# Patient Record
Sex: Female | Born: 1945 | Race: White | Hispanic: No | Marital: Married | Smoking: Never smoker
Health system: Southern US, Community
[De-identification: ages and names within clinical notes are randomized; demographics above are authoritative.]

## PROBLEM LIST (undated history)

## (undated) DIAGNOSIS — D869 Sarcoidosis, unspecified: Secondary | ICD-10-CM

## (undated) DIAGNOSIS — R635 Abnormal weight gain: Secondary | ICD-10-CM

## (undated) DIAGNOSIS — K219 Gastro-esophageal reflux disease without esophagitis: Secondary | ICD-10-CM

## (undated) DIAGNOSIS — I10 Essential (primary) hypertension: Secondary | ICD-10-CM

## (undated) HISTORY — DX: Gastro-esophageal reflux disease without esophagitis: K21.9

## (undated) HISTORY — DX: Sarcoidosis, unspecified: D86.9

## (undated) HISTORY — DX: Essential (primary) hypertension: I10

## (undated) HISTORY — DX: Abnormal weight gain: R63.5

---

## 1998-03-16 ENCOUNTER — Ambulatory Visit: Admission: RE | Admit: 1998-03-16 | Discharge: 1998-03-16 | Payer: Self-pay | Admitting: Internal Medicine

## 2000-05-12 ENCOUNTER — Ambulatory Visit (HOSPITAL_COMMUNITY): Admission: RE | Admit: 2000-05-12 | Discharge: 2000-05-12 | Payer: Self-pay | Admitting: Internal Medicine

## 2000-05-12 ENCOUNTER — Encounter: Payer: Self-pay | Admitting: Internal Medicine

## 2001-09-29 ENCOUNTER — Encounter: Admission: RE | Admit: 2001-09-29 | Discharge: 2001-09-29 | Payer: Self-pay | Admitting: Neurosurgery

## 2001-09-29 ENCOUNTER — Encounter: Payer: Self-pay | Admitting: Neurosurgery

## 2001-10-13 ENCOUNTER — Encounter: Payer: Self-pay | Admitting: Neurosurgery

## 2001-10-13 ENCOUNTER — Encounter: Admission: RE | Admit: 2001-10-13 | Discharge: 2001-10-13 | Payer: Self-pay | Admitting: Neurosurgery

## 2003-09-05 ENCOUNTER — Emergency Department (HOSPITAL_COMMUNITY): Admission: EM | Admit: 2003-09-05 | Discharge: 2003-09-05 | Payer: Self-pay | Admitting: Emergency Medicine

## 2003-12-13 ENCOUNTER — Ambulatory Visit: Payer: Self-pay | Admitting: Internal Medicine

## 2004-05-03 ENCOUNTER — Ambulatory Visit: Payer: Self-pay | Admitting: Internal Medicine

## 2004-06-15 ENCOUNTER — Ambulatory Visit: Payer: Self-pay | Admitting: Internal Medicine

## 2004-07-03 ENCOUNTER — Ambulatory Visit: Payer: Self-pay | Admitting: Internal Medicine

## 2004-07-19 ENCOUNTER — Ambulatory Visit: Payer: Self-pay | Admitting: Internal Medicine

## 2004-08-03 ENCOUNTER — Emergency Department (HOSPITAL_COMMUNITY): Admission: EM | Admit: 2004-08-03 | Discharge: 2004-08-03 | Payer: Self-pay | Admitting: Emergency Medicine

## 2004-08-06 ENCOUNTER — Encounter: Admission: RE | Admit: 2004-08-06 | Discharge: 2004-08-06 | Payer: Self-pay | Admitting: Internal Medicine

## 2004-12-17 ENCOUNTER — Ambulatory Visit: Payer: Self-pay | Admitting: Internal Medicine

## 2005-01-28 ENCOUNTER — Ambulatory Visit: Payer: Self-pay | Admitting: Internal Medicine

## 2005-02-25 ENCOUNTER — Ambulatory Visit: Payer: Self-pay | Admitting: Internal Medicine

## 2005-04-02 ENCOUNTER — Ambulatory Visit: Payer: Self-pay | Admitting: Internal Medicine

## 2005-05-15 ENCOUNTER — Ambulatory Visit: Payer: Self-pay | Admitting: Internal Medicine

## 2005-08-23 ENCOUNTER — Ambulatory Visit: Payer: Self-pay | Admitting: Internal Medicine

## 2005-09-03 ENCOUNTER — Ambulatory Visit: Payer: Self-pay | Admitting: Internal Medicine

## 2005-10-31 ENCOUNTER — Ambulatory Visit: Payer: Self-pay | Admitting: Internal Medicine

## 2006-06-24 ENCOUNTER — Ambulatory Visit: Payer: Self-pay | Admitting: Internal Medicine

## 2006-07-08 ENCOUNTER — Ambulatory Visit: Payer: Self-pay | Admitting: Internal Medicine

## 2006-09-23 ENCOUNTER — Ambulatory Visit: Payer: Self-pay | Admitting: Internal Medicine

## 2006-11-27 DIAGNOSIS — D869 Sarcoidosis, unspecified: Secondary | ICD-10-CM

## 2006-11-27 DIAGNOSIS — I10 Essential (primary) hypertension: Secondary | ICD-10-CM | POA: Insufficient documentation

## 2006-11-27 DIAGNOSIS — K219 Gastro-esophageal reflux disease without esophagitis: Secondary | ICD-10-CM

## 2007-04-17 ENCOUNTER — Ambulatory Visit: Payer: Self-pay | Admitting: Internal Medicine

## 2007-04-27 ENCOUNTER — Ambulatory Visit: Payer: Self-pay | Admitting: Internal Medicine

## 2007-05-14 ENCOUNTER — Ambulatory Visit: Payer: Self-pay | Admitting: Internal Medicine

## 2007-06-26 ENCOUNTER — Ambulatory Visit: Payer: Self-pay | Admitting: Internal Medicine

## 2007-08-07 ENCOUNTER — Telehealth (INDEPENDENT_AMBULATORY_CARE_PROVIDER_SITE_OTHER): Payer: Self-pay | Admitting: *Deleted

## 2007-10-23 ENCOUNTER — Telehealth (INDEPENDENT_AMBULATORY_CARE_PROVIDER_SITE_OTHER): Payer: Self-pay | Admitting: *Deleted

## 2007-10-23 ENCOUNTER — Ambulatory Visit: Payer: Self-pay | Admitting: Internal Medicine

## 2007-10-23 DIAGNOSIS — J069 Acute upper respiratory infection, unspecified: Secondary | ICD-10-CM | POA: Insufficient documentation

## 2007-10-29 ENCOUNTER — Telehealth: Payer: Self-pay | Admitting: Adult Health

## 2007-11-13 ENCOUNTER — Ambulatory Visit: Payer: Self-pay | Admitting: Internal Medicine

## 2008-07-26 ENCOUNTER — Ambulatory Visit: Payer: Self-pay | Admitting: Internal Medicine

## 2009-03-01 ENCOUNTER — Ambulatory Visit: Payer: Self-pay | Admitting: Internal Medicine

## 2009-04-12 ENCOUNTER — Ambulatory Visit: Payer: Self-pay | Admitting: Internal Medicine

## 2009-06-29 ENCOUNTER — Ambulatory Visit: Payer: Self-pay | Admitting: Internal Medicine

## 2009-07-09 ENCOUNTER — Encounter: Payer: Self-pay | Admitting: Internal Medicine

## 2009-07-13 ENCOUNTER — Telehealth (INDEPENDENT_AMBULATORY_CARE_PROVIDER_SITE_OTHER): Payer: Self-pay | Admitting: *Deleted

## 2009-07-14 ENCOUNTER — Ambulatory Visit: Payer: Self-pay | Admitting: Internal Medicine

## 2009-07-28 ENCOUNTER — Ambulatory Visit: Payer: Self-pay | Admitting: Internal Medicine

## 2009-09-15 ENCOUNTER — Ambulatory Visit: Payer: Self-pay | Admitting: Internal Medicine

## 2009-10-30 ENCOUNTER — Ambulatory Visit: Payer: Self-pay | Admitting: Internal Medicine

## 2009-12-20 ENCOUNTER — Ambulatory Visit: Payer: Self-pay | Admitting: Internal Medicine

## 2010-02-13 NOTE — Assessment & Plan Note (Signed)
Summary: Pulmonary/ f/u sarcoid try q 3 day prednisone   [facility] Provider[facility]Ref[facility] Provider:  [facility]  CC:  63 month followup.  Pt states overall she is doing well.  She does [place]/o PND and occ prod cough with clear sputum "getting over URI".  .  History of Present Illness: 65 yowf never smoker  with a history of sarcoid, manifested clinically with both shin rash and cough and recurrent since diagnosis in 2000 although in retrospect she  had previous parenchymal changes suggestive of sarcoid dating back at least 5 years prior to dx.  Tapered now to Prednisone 5mg  / day and  better until 6/7 cough sob and ns fatigue and no better by 6/10 so increased to 10 mg and now better..   rec to try to taper to prednsione 5mg  once daily (floor--20mg  ceiling)  October 23, 2007 ov @ 5mg / day complains of cough, congestion, sore throat, nasal drip/drainage, wheezing. cough mainly dry. no purulent sputum. Denies chest pain, dyspnea, orthopnea, hemoptysis, fever, n/v/d, edema, neck stiffness.  rx up to 20/d and zpak  November 13, 2007 tapered back to 5 mg successfullly no rash, cough, sob, aches and pains.  July 26, 2008 ov @ prednisone 5 mg /day no flare in symptoms. rec 5 mg every other day   March 01, 2009 63 month followup.  Pt states overall she is doing well.  She does c/o PND and occ prod cough with clear sputum "getting over URI".  no rash or typical fatigue like she had before prednisone. good ex tol. no ocular or articular symptoms. Pt denies any significant sore throat, dysphagia, itching, sneezing,  nasal congestion or excess secretions,  fever, chills, sweats, unintended wt loss, pleuritic or exertional cp, hempoptysis, change in activity tolerance  orthopnea pnd or leg swelling   Current Medications (verified): 1)  Prilosec 20 Mg  Cpdr (Omeprazole) .... Two Times A Day 2)  Micardis Hct 80-25 Mg  Tabs (Telmisartan-Hctz) .... 1/2 Once Daily 3)  Alavert 10 Mg  Tbdp (Loratadine) .... Once  Daily As Needed 4)  Prednisone 10 Mg  Tabs (Prednisone) .... 1/2 Every Other Day  Allergies (verified): No Known Drug Allergies  Past History:  Past Medical History: HYPERTENSION (ICD-401.9) WEIGHT GAIN    -  Target wt  =  179  for BMI < 30   GERD (ICD-530.81) SARCOIDOSIS (ICD-135) Prednisone 2000 -2006 skin/cough...............................Marland KitchenWert  - Pred restarted daily 04/2007 > every third day dosing March 01, 2009   Vital Signs:  Patient profile:   65 year old female Weight:      174.13 pounds BMI:     29.08 O2 Sat:      95 % on Room air Temp:     97.8 degrees F oral Pulse rate:   80 / minute BP sitting:   120 / 60  (left arm)  Vitals Entered By: Vernie Murders (March 01, 2009 10:01 AM)  O2 Flow:  Room air  Physical Exam  Additional Exam:  GENERAL:  A/Ox3; pleasant & cooperative.NAD  wt 191 > 188 July 27, 2008 > 174 March 01, 2009   HEENT:  [place]/AT,, EACs-clear, TMs-wnl, NOSE-clear, THROAT-clear & wnl. NECK:  Supple w/ fair ROM; no JVD; normal carotid impulses w/o bruits; no thyromegaly or nodules palpated; no lymphadenopathy. CHEST:  a few pops and squeaks on insp, no wheeze on exp HEART:  RRR, no m/r/g  heard ABDOMEN:  Soft & nt; nml bowel sounds; no organomegaly or masses detected. EXT: Warm bilat,  no calf pain, edema, clubbing, pulses intact Skin: no rash/lesion    CXR  Procedure date:  03/01/2009  Findings:       Comparison: 07/26/2008   Findings: Trachea is midline.  Widening of the mediastinal contours is likely due to adenopathy, stable.  Suspect bihilar adenopathy. There is coarsening of the pulmonary markings with areas of increased density and architectural distortion.  Appearance is unchanged from 07/26/2008.  No pleural fluid.   IMPRESSION: Mediastinal and probable hilar adenopathy, together with pulmonary parenchymal changes, are consistent with the given history of sarcoid.  No acute findings.    Impression &  Recommendations:  Problem # 1:  SARCOIDOSIS (ICD-135)  The goal with a chronic steroid dependent illness is always arriving at the lowest effective dose that controls the disease/symptoms and not accepting a set "formula" which is based on statistics that don't take into accound individual variability or the natural hx of the dz in every individual patient, which may well vary over time.   Since no progression on 5 mg daiily,  try 5 mg every 3rd day   Each maintenance medication was reviewed in detail including most importantly the difference between maintenance and as needed and under what circumstances the prns are to be used. See instructions for specific recommendations   Medications Added to Medication List This Visit: 1)  Prednisone 10 Mg Tabs (Prednisone) .... 1/2 every other day  Other Orders: Est. Patient Level III (04540) T-2 View CXR (71020TC)  Patient Instructions: 1)  Try 10 mg one half every 3rd day and resume every other day for any excess fatigue nausea rash cough or wors short of breath 2)  Please schedule a follow-up appointment in 6 weeks to consider stopping prednisone

## 2010-02-13 NOTE — Progress Notes (Signed)
Summary: talk to nurse  Phone Note Call from Patient Call back at Home Phone [phone]   Caller: Patient Call For: wert Reason for Call: Talk to Nurse Summary of Call: Pt states she was admitted to [facility] on 6/26 and d/c on 6/27, re: sarcoid flare up, wants to talk with nurse in ref to this and also about her prednisone increase. Initial call taken by: Darletta Moll,  July 13, 2009 10:05 AM  Follow-up for Phone Call        PAtient had recent hospitalization in [facility] for sarcoid flare. She will contact the hospital for her records and get them to make a cd of any films done while there. She will see MW on Friday, 07/14/2009 for follow-up. Thinks she needs to be seen beofre the holiday weekend to see if MW has any recs or wants to change any treatment. Follow-up by: Michel Bickers CMA,  July 13, 2009 10:31 AM

## 2010-02-13 NOTE — Assessment & Plan Note (Signed)
Summary: Pulmonary/  f/u ov doing better on dulera 200 and pred 5 mg qod    [facility]  CC:  Cough- much improved.  History of Present Illness: 65 yowf never smoker  with a history of sarcoid, manifested clinically with both shin rash and cough and recurrent since diagnosis in 2000 although in retrospect she  had previous parenchymal changes suggestive of sarcoid dating back at least 5 years prior to dx.  July 14, 2009  ov p  hospital discharged. from [facility] on 07/09/09 after having severe dry hacky cough and NVD.  She was discharged on 07/10/09.  c/o still coughing- occ prod with clear to green sputum.  She also c/o fatigue better breathing after albuterol.  rec dulera 100 / pred back up to 20 mg daily  July 28, 2009 ov  cough is much improved.  She states that she onyl has cough occ. Still feels fatigued but relates this to wt gain. still on 20 mg per day. no sob.  rec dulera 100 and use ceiling of 20 a floor of 5 mg every other day  September 15, 2009 ov c/o increase cough on 5 mg every other day x 10 days and so increased to 10 mg per day and back to baseline.  rec increase dulera to 200, work on hfa  October 30, 2009 cc  Dyspnea and cough- some better since last ov. Started to wheeze over the past few days despite consistent dulera 200 rx  no rash, ocular or articular co's. rec no change rx work on hfa  December 20, 2009 ov Cough- much improved on dulera 200 doing much better with hfa technique and pred maintained at 5 mg every other day, no sob, rash, ocular or articular c/o's.  Pt denies any significant sore throat, dysphagia, itching, sneezing,  nasal congestion or excess secretions,  fever, chills, sweats, unintended wt loss, pleuritic or exertional cp, hempoptysis, change in activity tolerance  orthopnea pnd or leg swelling     Current Medications (verified): 1)  Prilosec 20 Mg  Cpdr (Omeprazole) .... Take One 30-60 Min Before  First and Last Meals of The Day 2)  Micardis Hct 80-25 Mg  Tabs (Telmisartan-Hctz) .... 1/2 Once Daily 3)  Zyrtec Allergy 10 Mg Tabs (Cetirizine Hcl) .Marland Kitchen.. 1 Once Daily As Needed 4)  Prednisone 10 Mg  Tabs (Prednisone) .... 1/2 Once Daily Every Other Day 5)  Tramadol Hcl 50 Mg  Tabs (Tramadol Hcl) .... One To Two By Mouth Every 4-6 Hours For Pain or Cough 6)  Dulera 200-5 Mcg/act Aero (Mometasone Furo-Formoterol Fum) .... 2 Puffs First Thing  in Am and 2 Puffs Again in Pm About 12 Hours Later  Allergies (verified): No Known Drug Allergies  Past History:  Past Medical History: HYPERTENSION (ICD-401.9) WEIGHT GAIN    -  Target wt  =  179  for BMI < 30   GERD (ICD-530.81) SARCOIDOSIS (ICD-135) Prednisone 2000 -2006 rash/cough...................................Marland KitchenWert/ [place]  - Pred restarted daily 04/2007 > every third day dosing March 01, 2009 > taper off by May 14 2009 > flared by middle of May 2011 > resume prednisone May 28 2009 for sob/cough  -  dulera 100 for ? airway involvement > HFA 25% July 28, 2009 > 50% September 15, 2009,  rx 200 [place]  - [place] 90% October 30, 2009   Vital Signs:  Patient profile:   65 year old female Weight:  177 pounds BMI:     29.56 O2 Sat:      97 % on Room air Temp:     97.4 degrees F oral Pulse rate:   87 / minute BP sitting:   118 / 70  (left arm)  Vitals Entered By: Vernie Murders (December 20, 2009 3:20 PM)  O2 Flow:  Room air  Physical Exam  Additional Exam:  GENERAL:  A/Ox3; pleasant & cooperative.NAD   wt  188 July 27, 2008  > 177 July 28, 2009 > 171 September 15, 2009 > 176 October 30, 2009 > 177 December 20, 2009  HEENT:  [place]/AT,, EACs-clear, TMs-wnl, NOSE-clear, THROAT-clear & wnl. NECK:  Supple w/ fair ROM; no JVD; normal carotid impulses w/o bruits; no thyromegaly or nodules palpated; no lymphadenopathy. CHEST:  a few pops and squeaks on insp, no sign wheeze on exp HEART:  RRR, no m/r/g  heard ABDOMEN:  Soft & nt; nml  bowel sounds; no organomegaly or masses detected. EXT: Warm bilat,  no calf pain, edema, clubbing, pulses intact    Impression & Recommendations:  Problem # 1:  SARCOIDOSIS (ICD-135) The goal with a chronic steroid dependent illness is always arriving at the lowest effective dose that controls the disease/symptoms and not accepting a set "formula" which is based on statistics that don't take into accound individual variability or the natural hx of the dz in every individual patient, which may well vary over time.  on nearly physiologic levels of pred as floor @ 5 mg qod  Seems to be respondding to dulera 200  consistently     Each maintenance medication was reviewed in detail including most importantly the difference between maintenance and as needed and under what circumstances the prns are to be used. See instructions for specific recommendations   Medications Added to Medication List This Visit: 1)  Zyrtec Allergy 10 Mg Tabs (Cetirizine hcl) .Marland Kitchen.. 1 once daily as needed 2)  Prednisone 10 Mg Tabs (Prednisone) .... 1/2 once daily every other day  Other Orders: Est. Patient Level III (78295)  Patient Instructions: 1)  Prednisone celing is 20 mg taper to a floor 5 mg every day - if flare of symptoms ok to double the dose until better x 5 days  2)  continue dulera 200 2 puffs first thing  in am and 2 puffs again in pm about 12 hours later  3)  Return to office in 3 months, sooner if needed with cxr on return Prescriptions: DULERA 200-5 MCG/ACT AERO (MOMETASONE FURO-FORMOTEROL FUM) 2 puffs first thing  in am and 2 puffs again in pm about 12 hours later  #1 x 11   Entered and Authorized by:   Nyoka Cowden MD   Signed by:   Nyoka Cowden MD on 12/20/2009   Method used:   Electronically to        [facility] [id number] (retail)       [address]       [place], [place]  [address]       Ph: [phone]       Fax: [phone]   RxID:   [id number]

## 2010-02-13 NOTE — Assessment & Plan Note (Signed)
Summary: Pulmonary/ f/u sarcoid, taper off  by May 1    [facility]  [facility]  CC:  6 wk followup.  Pt states overall doing well.  Denies any cough or SOB.  She c/o rash on lower left leg x 1 wk.  .  History of Present Illness: 57 yowf never smoker  with a history of sarcoid, manifested clinically with both shin rash and cough and recurrent since diagnosis in 2000 although in retrospect she  had previous parenchymal changes suggestive of sarcoid dating back at least 5 years prior to dx.  Tapered now to Prednisone 5mg  / day and  better until 6/7 cough sob and ns fatigue and no better by 6/10 so increased to 10 mg and now better..   rec to try to taper to prednsione 5mg  once daily (floor--20mg  ceiling)  October 23, 2007 ov @ 5mg / day complains of cough, congestion, sore throat, nasal drip/drainage, wheezing. cough mainly dry. no purulent sputum. Denies chest pain, dyspnea, orthopnea, hemoptysis, fever, n/v/d, edema, neck stiffness.  rx up to 20/d and zpak  November 13, 2007 tapered back to 5 mg successfullly no rash, cough, sob, aches and pains.  July 26, 2008 ov @ prednisone 5 mg /day no flare in symptoms. rec 5 mg every other day   March 01, 2009 6 month followup.  Pt states overall she is doing well.  She does c/o PND and occ prod cough with clear sputum "getting over URI".  no rash or typical fatigue like she had before prednisone. good ex tol. no ocular or articular symptoms.  rec q 3day pred 10 one half  April 12, 2009 6 wk followup.  Pt states overall doing well.  Denies any cough or SOB.  She c/o rash on lower left leg x 1 wk.  no ocular or articular c/os, no cough or sob or nausea or fatigue. Pt denies any significant sore throat, dysphagia, itching, sneezing,  nasal congestion or excess secretions,  fever, chills, sweats, unintended wt loss, pleuritic or exertional cp, hempoptysis, change in activity tolerance  orthopnea pnd or leg swelling   Current  Medications (verified): 1)  Prilosec 20 Mg  Cpdr (Omeprazole) .... Two Times A Day 2)  Micardis Hct 80-25 Mg  Tabs (Telmisartan-Hctz) .... 1/2 Once Daily 3)  Alavert 10 Mg  Tbdp (Loratadine) .... Once Daily As Needed 4)  Prednisone 10 Mg  Tabs (Prednisone) .... 1/2 Every Third Day  Allergies (verified): No Known Drug Allergies  Past History:  Past Medical History: HYPERTENSION (ICD-401.9) WEIGHT GAIN    -  Target wt  =  179  for BMI < 30   GERD (ICD-530.81) SARCOIDOSIS (ICD-135) Prednisone 2000 -2006 skin/cough...............................Marland KitchenWert  - Pred restarted daily 04/2007 > every third day dosing March 01, 2009 > taper off by May 14 2009  Vital Signs:  Patient profile:   65 year old female Weight:      174 pounds O2 Sat:      96 % on Room air Temp:     97.4 degrees F oral Pulse rate:   101 / minute BP sitting:   114 / 78  (left arm)  Vitals Entered By: Vernie Murders (April 12, 2009 9:29 AM)  O2 Flow:  Room air  Physical Exam  Additional Exam:  GENERAL:  A/Ox3; pleasant & cooperative.NAD   wt  188 July 27, 2008 > 174 March 01, 2009  > 174 April 12, 2009  HEENT:  [place]/AT,, EACs-clear, TMs-wnl, NOSE-clear,  THROAT-clear & wnl. NECK:  Supple w/ fair ROM; no JVD; normal carotid impulses w/o bruits; no thyromegaly or nodules palpated; no lymphadenopathy. CHEST:  a few pops and squeaks on insp, no wheeze on exp HEART:  RRR, no m/r/g  heard ABDOMEN:  Soft & nt; nml bowel sounds; no organomegaly or masses detected. EXT: Warm bilat,  no calf pain, edema, clubbing, pulses intact Skin: purplish macular rash palm sized over L lat shin near lateral maleolus   Impression & Recommendations:  Problem # 1:  SARCOIDOSIS (ICD-135)  The goal with a chronic steroid dependent illness is always arriving at the lowest effective dose that controls the disease/symptoms and not accepting a set "formula" which is based on statistics that don't take into accound individual variability or  the natural hx of the dz in every individual patient, which may well vary over time.   Since no progression on 5 mg  every third day,   try 2. 5 mg every 3rd day until May 1 then off   Each maintenance medication was reviewed in detail including most importantly the difference between maintenance and as needed and under what circumstances the prns are to be used. See instructions for specific recommendations   Medications Added to Medication List This Visit: 1)  Prednisone 10 Mg Tabs (Prednisone) .... 1/2 every third day 2)  Prednisone 2.5 Mg Tabs (Prednisone) .... Take every 3rd day, stop on may 1  Other Orders: Est. Patient Level III (16109)  Patient Instructions: 1)  Prednsione 2.5 mg every 3rd day  every other day through May 1 and then stop for any excess fatigue nausea rash cough or worse short of breath 2)  Call your dermatologist about the ankle rash 3)  Please schedule a follow-up appointment in 6 weeks for cxr Prescriptions: PREDNISONE 2.5 MG TABS (PREDNISONE) Take every 3rd day, stop on May 1  #20 x 0   Entered and Authorized by:   Nyoka Cowden MD   Signed by:   Nyoka Cowden MD on 04/12/2009   Method used:   Electronically to        [facility] [address] (retail)       [address]       [place], [place]  [address]       Ph: [phone]       Fax: [phone]   RxID:   [id number]

## 2010-02-13 NOTE — Assessment & Plan Note (Signed)
Summary: Pulmonary/ f/u sarcoid > increase dulera to 200 2bid   [facility]Ref[facility] Provider:  [facility])  CC:  6 wk followup.  Pt states that when tapered down to prednisone to 5 mg every other day her cough and SOB flared back up- started back on 10 mg daily 1 wk ago and starting to improve again.Allison Osborn  History of Present Illness: 65 yowf never smoker  with a history of sarcoid, manifested clinically with both shin rash and cough and recurrent since diagnosis in 2000 although in retrospect she  had previous parenchymal changes suggestive of sarcoid dating back at least 5 years prior to dx.  Tapered now to Prednisone 5mg  / day and  better until 6/7 cough sob and ns fatigue and no better by 6/10 so increased to 10 mg and now better..   rec to try to taper to prednsione 5mg  once daily (floor--20mg  ceiling)  October 23, 2007 ov @ 5mg / day complains of cough, congestion, sore throat, nasal drip/drainage, wheezing. cough mainly dry. no purulent sputum. Denies chest pain, dyspnea, orthopnea, hemoptysis, fever, n/v/d, edema, neck stiffness.  rx up to 20/d and zpak  November 13, 2007 tapered back to 5 mg successfullly no rash, cough, sob, aches and pains.  July 26, 2008 ov @ prednisone 5 mg /day no flare in symptoms. rec 5 mg every other day   March 01, 2009 6 month followup.  Pt states overall she is doing well.  She does c/o PND and occ prod cough with clear sputum "getting over URI".  no rash or typical fatigue like she had before prednisone. good ex tol. no ocular or articular symptoms.  rec q 3day pred 10 one half  April 12, 2009 6 wk followup.  Pt states overall doing well.  Denies any cough or SOB.  She c/o rash on lower left leg x 1 wk.  no ocular or articular c/os tapered off May 14 2009 w/in 2 weeks cough tired sob and so went back on 10 mg   June 29, 2009 ov tapered back down 5 mg every other day since last flare in middle of may but still not completely better6 wk followup.  Pt  c/o cough since the middle of May.  Cough is non prod.   rec leave pred @ 10 until better admit 6/27 with ? pna rx with abx   see page 2 July 14, 2009 Followup after hospital discharged.  Pt states that she went to [facility] on 07/09/09 after having severe dry hacky cough and NVD.  She was discharged on 07/10/09.  She states that she is still coughing- occ prod with clear to green sputum.  She also c/o fatigue better breathing after albuterol.  rec dulera 100 and back up to 20 mg daily  July 28, 2009 ov  cough is much improved.  She states that she onyl has cough occ. Still feels fatigued but relates this to wt gain. still on 20 mg per day. no sob.  rec dulera 100 and use ceiling of 20 a floor of 5 mg every other day  September 15, 2009 ov c/o increase cough on 5 mg every other day x 10 days and so increased to 10 mg per day and back to baseline. Pt denies any significant sore throat, dysphagia, itching, sneezing,  nasal congestion or excess secretions,  fever, chills, sweats, unintended wt loss, pleuritic or exertional cp, hempoptysis, change in activity tolerance  orthopnea pnd or leg swelling  no rash, arthalgia, ocular co's  Current Medications (verified): 1)  Prilosec 20 Mg  Cpdr (Omeprazole) .... Take One 30-60 Min Before First and Last Meals of The Day 2)  Micardis Hct 80-25 Mg  Tabs (Telmisartan-Hctz) .... 1/2 Once Daily 3)  Zyrtec Allergy 10 Mg Tabs (Cetirizine Hcl) .Allison Osborn.. 1 Once Daily 4)  Prednisone 10 Mg  Tabs (Prednisone) .... Tapered Dose As Directed 5)  Dulera 100-5 Mcg/act Aero (Mometasone Furo-Formoterol Fum) .... 2 Puffs First Thing  in Am and 2 Puffs Again in Pm About 12 Hours Later 6)  Tramadol Hcl 50 Mg  Tabs (Tramadol Hcl) .... One To Two By Mouth Every 4-6 Hours For Pain or Cough  Allergies (verified): No Known Drug Allergies  Past History:  Past Medical History: HYPERTENSION (ICD-401.9) WEIGHT GAIN    -  Target wt  =  179  for BMI < 30   GERD  (ICD-530.81) SARCOIDOSIS (ICD-135) Prednisone 2000 -2006 rash/cough...............................Allison KitchenWert/ Dorinda Hill  - Pred restarted daily 04/2007 > every third day dosing March 01, 2009 > taper off by May 14 2009 > flared by middle of May 2011 > resume prednisone May 28 2009 for sob/cough  -  dulera 100 for ? airway involvement > HFA 25% July 28, 2009 > 50% September 15, 2009,  rx 200 dulera  Vital Signs:  Patient profile:   65 year old female Weight:      171 pounds O2 Sat:      92 % on Room air Temp:     98.1 degrees F oral Pulse rate:   82 / minute BP sitting:   144 / 82  (left arm)  Vitals Entered By: Vernie Murders (September 15, 2009 3:57 PM)  O2 Flow:  Room air  Physical Exam  Additional Exam:  GENERAL:  A/Ox3; pleasant & cooperative.NAD   wt  188 July 27, 2008  > 174 June 29, 2009 > 171 July 14, 2009 > 177 July 28, 2009 > 171 September 15, 2009  HEENT:  [place]/AT,, EACs-clear, TMs-wnl, NOSE-clear, THROAT-clear & wnl. NECK:  Supple w/ fair ROM; no JVD; normal carotid impulses w/o bruits; no thyromegaly or nodules palpated; no lymphadenopathy. CHEST:  a few pops and squeaks on insp, no sign wheeze on exp HEART:  RRR, no m/r/g  heard ABDOMEN:  Soft & nt; nml bowel sounds; no organomegaly or masses detected. EXT: Warm bilat,  no calf pain, edema, clubbing, pulses intact    Impression & Recommendations:  Problem # 1:  SARCOIDOSIS (ICD-135) The goal with a chronic steroid dependent illness is always arriving at the lowest effective dose that controls the disease/symptoms and not accepting a set "formula" which is based on statistics that don't take into accound individual variability or the natural hx of the dz in every individual patient, which may well vary over time.   Since most likely flaring with ? URI or pna (can't tell for sure) new ceiling of 20 mg per day and raise the floor dose to 5 mg every day and  try increase dulera to 200 2 puffs first thing  in am and 2  puffs again in pm about 12 hours later   I spent extra time with the patient today explaining optimal mdi  technique.  This improved from  50-75%% effective.  See instructions for specific recommendations    Each maintenance medication was reviewed in detail including most importantly the difference between maintenance and as needed and under what circumstances the  prns are to be used. See instructions for specific recommendations   Medications Added to Medication List This Visit: 1)  Zyrtec Allergy 10 Mg Tabs (Cetirizine hcl) .Allison Osborn.. 1 once daily 2)  Dulera 200-5 Mcg/act Aero (Mometasone furo-formoterol fum) .... 2 puffs first thing  in am and 2 puffs again in pm about 12 hours later  Other Orders: Est. Patient Level IV (38756)  Patient Instructions: 1)  Change dulera 200  2 puffs first thing  in am and 2 puffs again in pm about 12 hours later and Work on inhaler technique:  relax and blow all the way out then take a nice smooth deep breath back in, triggering the inhaler at same time you start breathing in, hold breath  a few seconds then rinse and gargle 2)  Prednisone celing is 20 mg taper to a floor 5 mg every day 3)  Please schedule a follow-up appointment in 6 weeks, sooner if needed  Prescriptions: DULERA 200-5 MCG/ACT AERO (MOMETASONE FURO-FORMOTEROL FUM) 2 puffs first thing  in am and 2 puffs again in pm about 12 hours later  #1 x 11   Entered and Authorized by:   Nyoka Cowden MD   Signed by:   Nyoka Cowden MD on 09/15/2009   Method used:   Print then Give to Patient   RxID:   [id number]

## 2010-02-13 NOTE — Assessment & Plan Note (Signed)
Summary: Pulmonary/  f/u ov with hfa at 50% p coaching   Primary Provider/Referring Provider:  [facility])  CC:  2 wk followup.  Pt states that cough is much improved.  She states that she onyl has cough occ. Still feels fatigued but relates this to wt gain.Marland Kitchen  History of Present Illness: 65 yowf never smoker  with a history of sarcoid, manifested clinically with both shin rash and cough and recurrent since diagnosis in 2000 although in retrospect she  had previous parenchymal changes suggestive of sarcoid dating back at least 5 years prior to dx.  Tapered now to Prednisone 5mg  / day and  better until 6/7 cough sob and ns fatigue and no better by 6/10 so increased to 10 mg and now better..   rec to try to taper to prednsione 5mg  once daily (floor--20mg  ceiling)  October 23, 2007 ov @ 5mg / day complains of cough, congestion, sore throat, nasal drip/drainage, wheezing. cough mainly dry. no purulent sputum. Denies chest pain, dyspnea, orthopnea, hemoptysis, fever, n/v/d, edema, neck stiffness.  rx up to 20/d and zpak  November 13, 2007 tapered back to 5 mg successfullly no rash, cough, sob, aches and pains.  July 26, 2008 ov @ prednisone 5 mg /day no flare in symptoms. rec 5 mg every other day   March 01, 2009 6 month followup.  Pt states overall she is doing well.  She does c/o PND and occ prod cough with clear sputum "getting over URI".  no rash or typical fatigue like she had before prednisone. good ex tol. no ocular or articular symptoms.  rec q 3day pred 10 one half  April 12, 2009 6 wk followup.  Pt states overall doing well.  Denies any cough or SOB.  She c/o rash on lower left leg x 1 wk.  no ocular or articular c/os tapered off May 14 2009 w/in 2 weeks cough tired sob and so went back on 10 mg   June 29, 2009 ov tapered back down 5 mg every other day since last flare in middle of may but still not completely better6 wk followup.  Pt c/o cough since the middle of May.  Cough is non  prod.   rec leave pred @ 10 until better admit 6/27 with ? pna rx with abx   see page 2 July 14, 2009 Followup after hospital discharged.  Pt states that she went to [facility] on 07/09/09 after having severe dry hacky cough and NVD.  She was discharged on 07/10/09.  She states that she is still coughing- occ prod with clear to green sputum.  She also c/o fatigue better breathing after albuterol.  rec dulera 100 and back up to 20 mg daily  July 28, 2009 ov t cough is much improved.  She states that she onyl has cough occ. Still feels fatigued but relates this to wt gain. still on 20 mg per day. no sob.  Pt denies any significant sore throat, dysphagia, itching, sneezing,  nasal congestion or excess secretions,  fever, chills, sweats, unintended wt loss, pleuritic or exertional cp, hempoptysis, change in activity tolerance  orthopnea pnd or leg swelling. Pt also denies any obvious fluctuation in symptoms with weather or environmental change or other alleviating or aggravating factors.       Current Medications (verified): 1)  Prilosec 20 Mg  Cpdr (Omeprazole) .... Take One 30-60 Min Before First and Last Meals of The Day 2)  Micardis Hct 80-25 Mg  Tabs (Telmisartan-Hctz) .... 1/2 Once Daily 3)  Alavert 10 Mg  Tbdp (Loratadine) .... Once Daily As Needed 4)  Prednisone 10 Mg  Tabs (Prednisone) .... Tapered Dose As Directed 5)  Halobetasol Propionate 0.05 % Oint (Halobetasol Propionate) .... Apply Two Times A Day As Needed 6)  Dulera 100-5 Mcg/act Aero (Mometasone Furo-Formoterol Fum) .... 2 Puffs First Thing  in Am and 2 Puffs Again in Pm About 12 Hours Later 7)  Tramadol Hcl 50 Mg  Tabs (Tramadol Hcl) .... One To Two By Mouth Every 4-6 Hours For Pain or Cough  Allergies (verified): No Known Drug Allergies  Past History:  Past Medical History: HYPERTENSION (ICD-401.9) WEIGHT GAIN    -  Target wt  =  179  for BMI < 30   GERD (ICD-530.81) SARCOIDOSIS (ICD-135) Prednisone 2000 -2006  skin/cough...............................Marland KitchenWert/ Dorinda Hill  - Pred restarted daily 04/2007 > every third day dosing March 01, 2009 > taper off by May 14 2009 > flared by middle of May 2011 > resume prednisone May 28 2009 for sob/cough  - Add dulera 100 for ? airway involvement > HFA 25% July 28, 2009   Vital Signs:  Patient profile:   65 year old female Weight:      177.13 pounds O2 Sat:      94 % on Room air Temp:     98.2 degrees F oral Pulse rate:   91 / minute BP sitting:   120 / 60  (left arm)  Vitals Entered By: Vernie Murders (July 28, 2009 10:39 AM)  O2 Flow:  Room air  Physical Exam  Additional Exam:  GENERAL:  A/Ox3; pleasant & cooperative.NAD   wt  188 July 27, 2008  > 174 June 29, 2009 > 171 July 14, 2009 > 177 July 28, 2009  HEENT:  [place]/AT,, EACs-clear, TMs-wnl, NOSE-clear, THROAT-clear & wnl. NECK:  Supple w/ fair ROM; no JVD; normal carotid impulses w/o bruits; no thyromegaly or nodules palpated; no lymphadenopathy. CHEST:  a few pops and squeaks on insp, no sign wheeze on exp HEART:  RRR, no m/r/g  heard ABDOMEN:  Soft & nt; nml bowel sounds; no organomegaly or masses detected. EXT: Warm bilat,  no calf pain, edema, clubbing, pulses intact Skin: purplish macular rash palm sized over L lat shin near lateral maleolus    Impression & Recommendations:  Problem # 1:  SARCOIDOSIS (ICD-135) The goal with a chronic steroid dependent illness is always arriving at the lowest effective dose that controls the disease/symptoms and not accepting a set "formula" which is based on statistics that don't take into accound individual variability or the natural hx of the dz in every individual patient, which may well vary over time.   Since most likely flaring with ? URI or pna (can't tell for sure) new ceiling of 20 mg per day and raise the floor dose to 5 mg every other day and add dulera for? airway involvement.  I spent extra time with the patient today explaining optimal mdi   technique.  This improved from  25 > 50% effective.  See instructions for specific recommendations    Each maintenance medication was reviewed in detail including most importantly the difference between maintenance and as needed and under what circumstances the prns are to be used. See instructions for specific recommendations   Other Orders: Est. Patient Level IV (04540)  Patient Instructions: 1)  stay on dulera 100  2 puffs first thing  in am and 2 puffs  again in pm about 12 hours later and Work on inhaler technique:  relax and blow all the way out then take a nice smooth deep breath back in, triggering the inhaler at same time you start breathing in, hold breath  a few seconds then rinse and gargle 2)  Prednisone celing is 20 mg taper to a floor 5 mg every other day 3)  Please schedule a follow-up appointment in 6 weeks, sooner if needed

## 2010-02-13 NOTE — Assessment & Plan Note (Signed)
Summary: Pulmonary/ f/u sarcoidosis   [facility]  [facility]  CC:  6 wk followup.  Pt c/o cough since the middle of May.  Cough is non prod.  She states that the cough started getting worse after decreased prednisone so she increased back to 5 mg every other day.  She also c/o fatigue and increased SOB over the past several wks.  .  History of Present Illness: 65 yowf never smoker  with a history of sarcoid, manifested clinically with both shin rash and cough and recurrent since diagnosis in 2000 although in retrospect she  had previous parenchymal changes suggestive of sarcoid dating back at least 5 years prior to dx.  Tapered now to Prednisone 5mg  / day and  better until 6/7 cough sob and ns fatigue and no better by 6/10 so increased to 10 mg and now better..   rec to try to taper to prednsione 5mg  once daily (floor--20mg  ceiling)  October 23, 2007 ov @ 5mg / day complains of cough, congestion, sore throat, nasal drip/drainage, wheezing. cough mainly dry. no purulent sputum. Denies chest pain, dyspnea, orthopnea, hemoptysis, fever, n/v/d, edema, neck stiffness.  rx up to 20/d and zpak  November 13, 2007 tapered back to 5 mg successfullly no rash, cough, sob, aches and pains.  July 26, 2008 ov @ prednisone 5 mg /day no flare in symptoms. rec 5 mg every other day   March 01, 2009 6 month followup.  Pt states overall she is doing well.  She does c/o PND and occ prod cough with clear sputum "getting over URI".  no rash or typical fatigue like she had before prednisone. good ex tol. no ocular or articular symptoms.  rec q 3day pred 10 one half  April 12, 2009 6 wk followup.  Pt states overall doing well.  Denies any cough or SOB.  She c/o rash on lower left leg x 1 wk.  no ocular or articular c/os tapered off May 14 2009 w/in 2 weeks cough tired sob and so went back on 10 mg   June 29, 2009 ov tapered back down 5 mg every other day since last flare in middle of may  but still not completely better6 wk followup.  Pt c/o cough since the middle of May.  Cough is non prod.   She also c/o fatigue and increased SOB over the past several wks.  no increase rash, ocular or articular c/os.  Pt denies any significant sore throat, nasal congestion or excess secretions, fever, chills, sweats, unintended wt loss, pleuritic or exertional cp, orthopnea pnd or leg swelling.    Current Medications (verified): 1)  Prilosec 20 Mg  Cpdr (Omeprazole) .... Two Times A Day 2)  Micardis Hct 80-25 Mg  Tabs (Telmisartan-Hctz) .... 1/2 Once Daily 3)  Alavert 10 Mg  Tbdp (Loratadine) .... Once Daily As Needed 4)  Prednisone 10 Mg  Tabs (Prednisone) .... 1/2 Every Other Day 5)  Halobetasol Propionate 0.05 % Oint (Halobetasol Propionate) .... Apply Two Times A Day As Needed  Allergies (verified): No Known Drug Allergies  Past History:  Past Medical History: HYPERTENSION (ICD-401.9) WEIGHT GAIN    -  Target wt  =  179  for BMI < 30   GERD (ICD-530.81) SARCOIDOSIS (ICD-135) Prednisone 2000 -2006 skin/cough...............................Marland KitchenWert/ Dorinda Hill  - Pred restarted daily 04/2007 > every third day dosing March 01, 2009 > taper off by May 14 2009 > flared by middle of May 2011 > resume prednisone May 28 2009 for sob/cough  Vital Signs:  Patient profile:   65 year old female Weight:      174 pounds O2 Sat:      96 % on Room air Temp:     97.7 degrees F oral Pulse rate:   96 / minute BP sitting:   124 / 74  (left arm)  Vitals Entered By: Vernie Murders (June 29, 2009 10:53 AM)  O2 Flow:  Room air  Physical Exam  Additional Exam:  GENERAL:  A/Ox3; pleasant & cooperative.NAD   wt  188 July 27, 2008 > 174 March 01, 2009  > 174 April 12, 2009 > 174 June 29, 2009  HEENT:  [place]/AT,, EACs-clear, TMs-wnl, NOSE-clear, THROAT-clear & wnl. NECK:  Supple w/ fair ROM; no JVD; normal carotid impulses w/o bruits; no thyromegaly or nodules palpated; no lymphadenopathy. CHEST:   a few pops and squeaks on insp, no wheeze on exp HEART:  RRR, no m/r/g  heard ABDOMEN:  Soft & nt; nml bowel sounds; no organomegaly or masses detected. EXT: Warm bilat,  no calf pain, edema, clubbing, pulses intact Skin: purplish macular rash palm sized over L lat shin near lateral maleolus, improved pigmentation, slt dimiinished in size   CXR  Procedure date:  06/29/2009  Findings:        IMPRESSION: No significant change in manifestations of chronic sarcoidosis involving the chest.  Impression & Recommendations:  Problem # 1:  SARCOIDOSIS (ICD-135) The goal with a chronic steroid dependent illness is always arriving at the lowest effective dose that controls the disease/symptoms and not accepting a set "formula" which is based on statistics that don't take into accound individual variability or the natural hx of the dz in every individual patient, which may well vary over time.   Since no progression  on alternate day therapy try a ceiling of 10 mg per day and a floor of 2.5 mg every other day    Each maintenance medication was reviewed in detail including most importantly the difference between maintenance and as needed and under what circumstances the prns are to be used. See instructions for specific recommendations   Medications Added to Medication List This Visit: 1)  Prednisone 10 Mg Tabs (Prednisone) .... 1/2 every other day 2)  Halobetasol Propionate 0.05 % Oint (Halobetasol propionate) .... Apply two times a day as needed  Other Orders: T-2 View CXR (71020TC) Est. Patient Level IV (04540)  Patient Instructions: 1)  Prednisone 10 mg daily until 100% then taper floor 2.5 every other day  2)  Return to office in 3 months, sooner if needed

## 2010-02-13 NOTE — Assessment & Plan Note (Signed)
Summary: Pulmonary/ ? sarcoid flare   [facility]Referring Provider:  [facility]  CC:  Followup after hospital discharged.  Pt states that she went to [facility] on 07/09/09 after having severe dry hacky cough and NVD.  She was discharged on 07/10/09.  She states that she is still coughing- occ prod with clear to green sputum.  She also c/o fatigue.Marland Kitchen  History of Present Illness: 86 yowf never smoker  with a history of sarcoid, manifested clinically with both shin rash and cough and recurrent since diagnosis in 2000 although in retrospect she  had previous parenchymal changes suggestive of sarcoid dating back at least 5 years prior to dx.  Tapered now to Prednisone 5mg  / day and  better until 6/7 cough sob and ns fatigue and no better by 6/10 so increased to 10 mg and now better..   rec to try to taper to prednsione 5mg  once daily (floor--20mg  ceiling)  October 23, 2007 ov @ 5mg / day complains of cough, congestion, sore throat, nasal drip/drainage, wheezing. cough mainly dry. no purulent sputum. Denies chest pain, dyspnea, orthopnea, hemoptysis, fever, n/v/d, edema, neck stiffness.  rx up to 20/d and zpak  November 13, 2007 tapered back to 5 mg successfullly no rash, cough, sob, aches and pains.  July 26, 2008 ov @ prednisone 5 mg /day no flare in symptoms. rec 5 mg every other day   March 01, 2009 6 month followup.  Pt states overall she is doing well.  She does c/o PND and occ prod cough with clear sputum "getting over URI".  no rash or typical fatigue like she had before prednisone. good ex tol. no ocular or articular symptoms.  rec q 3day pred 10 one half  April 12, 2009 6 wk followup.  Pt states overall doing well.  Denies any cough or SOB.  She c/o rash on lower left leg x 1 wk.  no ocular or articular c/os tapered off May 14 2009 w/in 2 weeks cough tired sob and so went back on 10 mg   June 29, 2009 ov tapered back down 5 mg every other day since last flare in middle of  may but still not completely better6 wk followup.  Pt c/o cough since the middle of May.  Cough is non prod.   rec leave pred @ 10 until better admit 6/27 with ? pna rx with abx   see page 2 July 14, 2009 Followup after hospital discharged.  Pt states that she went to [facility] on 07/09/09 after having severe dry hacky cough and NVD.  She was discharged on 07/10/09.  She states that she is still coughing- occ prod with clear to green sputum.  She also c/o fatigue better breathing after albuterol.  Pt denies any significant sore throat, dysphagia, itching, sneezing,  nasal congestion or excess secretions,  fever, chills, sweats, unintended wt loss, pleuritic or exertional cp, hempoptysis, change in activity tolerance  orthopnea pnd or leg swelling   Current Medications (verified): 1)  Prilosec 20 Mg  Cpdr (Omeprazole) .... Two Times A Day 2)  Micardis Hct 80-25 Mg  Tabs (Telmisartan-Hctz) .... 1/2 Once Daily 3)  Alavert 10 Mg  Tbdp (Loratadine) .... Once Daily As Needed 4)  Prednisone 10 Mg  Tabs (Prednisone) .... Tapered Dose As Directed 5)  Halobetasol Propionate 0.05 % Oint (Halobetasol Propionate) .... Apply Two Times A Day As Needed  Allergies (verified): No Known Drug Allergies  Past History:  Past Medical History: HYPERTENSION (  ICD-401.9) WEIGHT GAIN    -  Target wt  =  179  for BMI < 30   GERD (ICD-530.81) SARCOIDOSIS (ICD-135) Prednisone 2000 -2006 skin/cough...............................Marland KitchenWert/ Dorinda Hill  - Pred restarted daily 04/2007 > every third day dosing March 01, 2009 > taper off by May 14 2009 > flared by middle of May 2011 > resume prednisone May 28 2009 for sob/cough  - Add dulera 100 for ? airway involvement  Vital Signs:  Patient profile:   65 year old female Weight:      171 pounds O2 Sat:      96 % on Room air Temp:     98.2 degrees F oral Pulse rate:   106 / minute BP sitting:   132 / 88  (left arm)  Vitals Entered By: Vernie Murders (July 14, 2009 11:52 AM)  O2 Flow:  Room air  Physical Exam  Additional Exam:  GENERAL:  A/Ox3; pleasant & cooperative.NAD   wt  188 July 27, 2008 > 174 March 01, 2009  > 174 April 12, 2009 > 174 June 29, 2009 > 171 July 14, 2009  HEENT:  [place]/AT,, EACs-clear, TMs-wnl, NOSE-clear, THROAT-clear & wnl. NECK:  Supple w/ fair ROM; no JVD; normal carotid impulses w/o bruits; no thyromegaly or nodules palpated; no lymphadenopathy. CHEST:  a few pops and squeaks on insp, minimal  wheeze on exp HEART:  RRR, no m/r/g  heard ABDOMEN:  Soft & nt; nml bowel sounds; no organomegaly or masses detected. EXT: Warm bilat,  no calf pain, edema, clubbing, pulses intact Skin: purplish macular rash palm sized over L lat shin near lateral maleolus    CXR  Procedure date:  07/09/2009  Findings:      diffuse increase markings with no def as dz  Impression & Recommendations:  Problem # 1:  SARCOIDOSIS (ICD-135) The goal with a chronic steroid dependent illness is always arriving at the lowest effective dose that controls the disease/symptoms and not accepting a set "formula" which is based on statistics that don't take into accound individual variability or the natural hx of the dz in every individual patient, which may well vary over time.   Since most likely flaring with ? URI or pna (can't tell for sure) new ceiling of 20 mg per day and raise the floor dose to 5 mg every other day and add dulera for? airway involvment.  I spent extra time with the patient today explaining optimal mdi  technique.  This improved from  50-75% effective   Each maintenance medication was reviewed in detail including most importantly the difference between maintenance and as needed and under what circumstances the prns are to be used. See instructions for specific recommendations   Medications Added to Medication List This Visit: 1)  Prilosec 20 Mg Cpdr (Omeprazole) .... Take one 30-60 min before first and last meals of the day 2)   Prednisone 10 Mg Tabs (Prednisone) .... Tapered dose as directed 3)  Prednisone 10 Mg Tabs (Prednisone) .... Tapered dose as directed 4)  Dulera 100-5 Mcg/act Aero (Mometasone furo-formoterol fum) .... 2 puffs first thing  in am and 2 puffs again in pm about 12 hours later 5)  Tramadol Hcl 50 Mg Tabs (Tramadol hcl) .... One to two by mouth every 4-6 hours for pain or cough  Other Orders: Est. Patient Level IV (57322)  Patient Instructions: 1)  Prednisone 20 mg daily until 100% then taper floor 5  every other day  2)  Dulera 100 2 puffs first thing  in am and 2 puffs again in pm about 12 hours later  3)  Work on inhaler technique:  relax and blow all the way out then take a nice smooth deep breath back in, triggering the inhaler at same time you start breathing in  4)  Take delsym two tsp every 12 hours and add tramadol 50 mg up to every 4 hours to suppress the urge to cough. Swallowing water or using ice chips/non mint and menthol containing candies (such as lifesavers or sugarless jolly ranchers) are also effective.  5)  GERD (REFLUX)  is a common cause of respiratory symptoms. It commonly presents without heartburn and can be treated with medication, but also with lifestyle changes including avoidance of late meals, excessive alcohol, smoking cessation, and avoid fatty foods, chocolate, peppermint, colas, red wine, and acidic juices such as orange juice. NO MINT OR MENTHOL PRODUCTS SO NO COUGH DROPS  6)  USE SUGARLESS CANDY INSTEAD (jolley ranchers)  7)  NO OIL BASED VITAMINS  8)  Please schedule a follow-up appointment in 2 weeks, sooner if needed  Prescriptions: TRAMADOL HCL 50 MG  TABS (TRAMADOL HCL) One to two by mouth every 4-6 hours for pain or cough  #40 x 0   Entered and Authorized by:   Nyoka Cowden MD   Signed by:   Nyoka Cowden MD on 07/14/2009   Method used:   Electronically to        [facility] [id number] (retail)       [address]       [place], [place]   [address]       Ph: [phone]       Fax: [phone]   RxID:   [id number] PREDNISONE 10 MG  TABS (PREDNISONE) tapered dose as directed  #100 x 3   Entered and Authorized by:   Nyoka Cowden MD   Signed by:   Nyoka Cowden MD on 07/14/2009   Method used:   Electronically to        [facility] [address] (retail)       [address]       [place], [place]  [address]       Ph: [phone]       Fax: [phone]   RxID:   [id number]

## 2010-02-13 NOTE — Assessment & Plan Note (Signed)
Summary: Pulmonary/  fu ov hfa now 90% effective p coaching   [facility]Ref[facility]  CC:  Dyspnea and cough- some better since last ov. Started to wheeze over the past few days.Marland Kitchen  History of Present Illness: 89 yowf never smoker  with a history of sarcoid, manifested clinically with both shin rash and cough and recurrent since diagnosis in 2000 although in retrospect she  had previous parenchymal changes suggestive of sarcoid dating back at least 5 years prior to dx.  Tapered now to Prednisone 5mg  / day and  better until 6/7 cough sob and ns fatigue and no better by 6/10 so increased to 10 mg and now better..   rec to try to taper to prednsione 5mg  once daily (floor--20mg  ceiling)  October 23, 2007 ov @ 5mg / day complains of cough, congestion, sore throat, nasal drip/drainage, wheezing. cough mainly dry. no purulent sputum. Denies chest pain, dyspnea, orthopnea, hemoptysis, fever, n/v/d, edema, neck stiffness.  rx up to 20/d and zpak  November 13, 2007 tapered back to 5 mg successfullly no rash, cough, sob, aches and pains.  July 26, 2008 ov @ prednisone 5 mg /day no flare in symptoms. rec 5 mg every other day   March 01, 2009 6 month followup.  Pt states overall she is doing well.  She does c/o PND and occ prod cough with clear sputum "getting over URI".  no rash or typical fatigue like she had before prednisone. good ex tol. no ocular or articular symptoms.  rec q 3day pred 10 one half  April 12, 2009 6 wk followup.  Pt states overall doing well.  Denies any cough or SOB.  She c/o rash on lower left leg x 1 wk.  no ocular or articular c/os tapered off May 14 2009 w/in 2 weeks cough tired sob and so went back on 10 mg   June 29, 2009 ov tapered back down 5 mg every other day since last flare in middle of may but still not completely better6 wk followup.  Pt c/o cough since the middle of May.  Cough is non prod.   rec leave pred @ 10 until better admit 6/27 with  ? pna rx with abx   see page 2 July 14, 2009 Followup after hospital discharged.  Pt states that she went to [facility] on 07/09/09 after having severe dry hacky cough and NVD.  She was discharged on 07/10/09.  She states that she is still coughing- occ prod with clear to green sputum.  She also c/o fatigue better breathing after albuterol.  rec dulera 100 and back up to 20 mg daily  July 28, 2009 ov  cough is much improved.  She states that she onyl has cough occ. Still feels fatigued but relates this to wt gain. still on 20 mg per day. no sob.  rec dulera 100 and use ceiling of 20 a floor of 5 mg every other day  September 15, 2009 ov c/o increase cough on 5 mg every other day x 10 days and so increased to 10 mg per day and back to baseline. Pt denies any significant sore throat, dysphagia, itching, sneezing,  nasal congestion or excess secretions,  fever, chills, sweats, unintended wt loss, pleuritic or exertional cp, hempoptysis, change in activity tolerance  orthopnea pnd or leg swelling  no rash, arthalgia, ocular co's  October 30, 2009 cc  Dyspnea and cough- some better since last ov. Started to wheeze  over the past few days despite consistent dulera 200 rx  no rash, ocular or articular co's. Pt denies any significant sore throat, dysphagia, itching, sneezing,  nasal congestion or excess secretions,  fever, chills, sweats, unintended wt loss, pleuritic or exertional cp, hempoptysis, change in activity tolerance  orthopnea pnd or leg swelling Pt also denies any obvious fluctuation in symptoms with weather or environmental change or other alleviating or aggravating factors.       Current Medications (verified): 1)  Prilosec 20 Mg  Cpdr (Omeprazole) .... Take One 30-60 Min Before First and Last Meals of The Day 2)  Micardis Hct 80-25 Mg  Tabs (Telmisartan-Hctz) .... 1/2 Once Daily 3)  Zyrtec Allergy 10 Mg Tabs (Cetirizine Hcl) .Marland Kitchen.. 1 Once Daily 4)  Prednisone 10 Mg  Tabs (Prednisone) ....  1/2 Once Daily 5)  Tramadol Hcl 50 Mg  Tabs (Tramadol Hcl) .... One To Two By Mouth Every 4-6 Hours For Pain or Cough 6)  Dulera 200-5 Mcg/act Aero (Mometasone Furo-Formoterol Fum) .... 2 Puffs First Thing  in Am and 2 Puffs Again in Pm About 12 Hours Later  Allergies (verified): No Known Drug Allergies  Past History:  Past Medical History: HYPERTENSION (ICD-401.9) WEIGHT GAIN    -  Target wt  =  179  for BMI < 30   GERD (ICD-530.81) SARCOIDOSIS (ICD-135) Prednisone 2000 -2006 rash/cough...............................Marland KitchenWert/ Dorinda Hill  - Pred restarted daily 04/2007 > every third day dosing March 01, 2009 > taper off by May 14 2009 > flared by middle of May 2011 > resume prednisone May 28 2009 for sob/cough  -  dulera 100 for ? airway involvement > HFA 25% July 28, 2009 > 50% September 15, 2009,  rx 200 dulera  - [place] 90% October 30, 2009   Vital Signs:  Patient profile:   65 year old female Weight:      176 pounds O2 Sat:      96 % on Room air Temp:     98.0 degrees F oral Pulse rate:   95 / minute BP sitting:   120 / 80  (left arm)  Vitals Entered By: Vernie Murders (October 30, 2009 12:13 PM)  O2 Flow:  Room air  Physical Exam  Additional Exam:  GENERAL:  A/Ox3; pleasant & cooperative.NAD   wt  188 July 27, 2008  > 174 June 29, 2009 > 171 July 14, 2009 > 177 July 28, 2009 > 171 September 15, 2009 > 176 October 30, 2009  HEENT:  [place]/AT,, EACs-clear, TMs-wnl, NOSE-clear, THROAT-clear & wnl. NECK:  Supple w/ fair ROM; no JVD; normal carotid impulses w/o bruits; no thyromegaly or nodules palpated; no lymphadenopathy. CHEST:  a few pops and squeaks on insp, no sign wheeze on exp HEART:  RRR, no m/r/g  heard ABDOMEN:  Soft & nt; nml bowel sounds; no organomegaly or masses detected. EXT: Warm bilat,  no calf pain, edema, clubbing, pulses intact    Impression & Recommendations:  Problem # 1:  SARCOIDOSIS (ICD-135) The goal with a chronic steroid dependent illness is  always arriving at the lowest effective dose that controls the disease/symptoms and not accepting a set "formula" which is based on statistics that don't take into accound individual variability or the natural hx of the dz in every individual patient, which may well vary over time.   Would like to hold the floor as low as possible but despite dulera 200 still having breakthru symptoms, though mild.  Will leave  ceiling and floor where they are for now and optimize airway rx:  I spent extra time with the patient today explaining optimal mdi  technique.  This improved from  75-90%  effective. See instructions for specific recommendations    Each maintenance medication was reviewed in detail including most importantly the difference between maintenance and as needed and under what circumstances the prns are to be used. See instructions for specific recommendations   Medications Added to Medication List This Visit: 1)  Prednisone 10 Mg Tabs (Prednisone) .... 1/2 once daily  Other Orders: Est. Patient Level III ([address])  Patient Instructions: 1)  Prednisone celing is 20 mg taper to a floor 5 mg every day - if flare of symptoms ok to double the dose until better x 5 days  2)  Please schedule a follow-up appointment in 4 weeks, sooner if needed

## 2010-05-29 NOTE — Assessment & Plan Note (Signed)
[facility]                             PULMONARY OFFICE NOTE   NAME:Allison Osborn, MAKYNLI STILLS                      MRN:          [id number]  DATE:09/23/2006                            DOB:          1945-01-23    HISTORY:  A 65 year old white female with a history of sarcoid, most  recently seen for upper airway cough that I thought was probably related  to use of ACE inhibitors and not sarcoid.  This cough resolved and she  comes back today acutely worse over the last four days with a hacking  cough, scratchy throat, and slightly discolored nasal discharge, but no  significant sputum production otherwise.  She denies any fevers, chills,  pleuritic pain, or increased dyspnea.   PHYSICAL EXAMINATION:  GENERAL:  She is a pleasant, ambulatory white  female in no acute distress.  VITAL SIGNS:  Stable.  HEENT:  Unremarkable.  Pharynx clear.  LUNGS:  Completely clear to auscultation and percussion bilaterally.  HEART:  Regular rate and rhythm without murmurs, rubs, or gallops.  ABDOMEN:  Soft and benign.  EXTREMITIES:  Warm without calf tenderness, cyanosis, clubbing, or  edema.   Saturation 97% on room air.   IMPRESSION:  No evidence of active sarcoid.  Presently she appears to  have a URI which may be self limited but since she does have a tendency  to cyclical coughing, I reviewed with her optimal treatment for this  including the use of Mucinex DM, and Advil cold and sinus with the  option of using Doxycycline if she has persistent discolored sputum or  her condition worsens and also the option of taking a 6-day course of  prednisone.   I have arranged to see her back in 3 months for follow-up chest x-ray  for sarcoid surveillance.  We will certainly see her in the meantime if  any of her symptoms relapse.   Because she has a history of ACE inhibitor intolerance, she most likely  also has reflux and I have emphasized that Prilosec should be taken  b.i.d. at the onset of any upper respiratory symptoms, preferably before  meals and advised her to do so now along with non-menthol containing  lozenges like sugarless candy to prevent excessive throat clearing.     Charlaine Dalton. Sherene Sires, MD, [facility]  Electronically Signed    MBW/MedQ  DD: 09/23/2006  DT: 09/23/2006  Job #: [id number]   cc:   Ernestina Penna, M.D.

## 2010-05-29 NOTE — Assessment & Plan Note (Signed)
 [facility]                             PULMONARY OFFICE NOTE   NAME:Helbert, Allison Osborn                      MRN:          [id number]  DATE:07/08/2006                            DOB:          December 22, 1945    HISTORY OF PRESENT ILLNESS:  This is a 65 year old white female patient  of Dr. Thurston Hole who has a known history of sarcoid that presents today  complaining of cough.  The patient was seen in the office 2 weeks ago  for similar symptoms and felt to have some upper airway instabilities  secondary to ACE-inhibitor and possibly also reflux.  She was  recommended  to change over from benazepril to Micardis and was started  on Prilosec daily.  The patient reports her symptoms did substantially  improve; however, over the last few days cough has returned and now is  persistent with some shortness of breath with activity.  The patient  denies any hemoptysis, purulent sputum, fevers, chest pain, orthopnea,  PND or leg swelling.  Patient does complain that she has had some  increased rash along her right leg, is chronic in nature with her  sarcoid flares.   PAST MEDICAL HISTORY:  Is reviewed.   CURRENT MEDICATIONS:  Reviewed.   PHYSICAL EXAMINATION:  The patient is a pleasant female in no acute  distress.  She is afebrile with stable vital signs.  O2 saturation is 97% on room  air.  HEENT:  Unremarkable.  NECK:  Supple without cervical adenopathy.  No JVD.  Lung sounds are clear to auscultation bilaterally.  CARDIAC:  Is a regular rate and rhythm.  ABDOMEN:  Soft and nontender.  EXTREMITIES:  Warm without any calf tenderness, cyanosis, clubbing or  edema.  Patient does have a few scattered pinkish to purplish plaques  along the right anterior shin.   IMPRESSION AND PLAN:  Cough, suspect is multifactorial in nature along  with some upper airway instability and a sarcoid flare.  Patient will  begin prednisone at 20 mg daily x1 week and then 10 mg for 1  week and  then stop.  She will add in Mucinex DM twice daily for cough.  She  will remain off of her ACE-inhibitor and continue on Prilosec 20 mg  twice daily. Patient will return back with Dr. Sherene Sires as scheduled for her  pulmonary function tests or sooner if needed.     Rubye Oaks, NP  Electronically Signed      Charlaine Dalton. Sherene Sires, MD, [facility]  Electronically Signed   TP/MedQ  DD: 07/09/2006  DT: 07/09/2006  Job #: [id number]

## 2010-05-29 NOTE — Assessment & Plan Note (Signed)
[facility]                             PULMONARY OFFICE NOTE   NAME:Osborn, Allison MCHANEY                      MRN:          [id number]  DATE:06/24/2006                            DOB:          02-07-1945    HISTORY:  This is a very nice 65 year old white female never smoker who  carries diagnosis of sarcoid and comes in today with increasing dry  cough over the last several weeks with increased dyspnea with exertion.  She denies any weather environmental triggers for her complaints and has  a sensation that there is mucus draining down her throat but actually  has not produced any mucus.  She denies any nocturnal exacerbations of  the cough or excess sputum production in the mornings.  Fevers, chills,  ocular or articular complaints or rash.   Note that since her previous visit she was started on ACE inhibitors in  the form of benazepril.   PHYSICAL EXAMINATION:  She is a pleasant ambulatory white female in no  acute distress.  She is afebrile with normal vital signs.  HEENT:  Unremarkable.  OROPHARYNX:  Clear.  LUNG FIELDS:  Completely clear bilaterally to auscultation and  percussion except for classic pseudowheeze.  There is a regular rhythm  without murmur, gallop or rub.  ABDOMEN:  Soft, benign.  EXTREMITIES:  Warm without any calf tenderness, cyanosis, clubbing or  edema.   Chest x-ray is pending.   IMPRESSION:  Classic pseudowheeze and dry cough are typical of ACE  inhibitor intolerant.  I suspect this is because she also is refluxing  and refluxing is irritating the upper airway while the inflammation  ensues, this goes unbridled because of the interference of ACE  inhibitors with bradykinin metabolism.   I therefore recommend the following approach:  1. First, I re-emphasized optimal treatment for reflux which includes      both a diet flier (which I reviewed with her) along with taking      Prilosec 20 mg 30 min before breakfast.  2.  I would like her to switch off benazepril in favor of Micardis      80/12.5 (I gave her 5 boxes, which would be 10 weeks, worth of      samples at this dosing) and have her check back with Dr. Kathi Der      office in [facility] for long-term hypertension treatment before      the samples run out.  3. I reviewed with her PFTs which indicate no significant loss in lung      function off of prednisone for sarcoid since December, 2006,      according to my records, and hopefully will be able to maintain her      off prednisone indefinitely.  However, since cough is one of the      prominent symptoms she has experienced from sarcoid, I would avoid      ACE inhibitors and hope that the cough will resolve without need      for further cough and dyspnea will resolve      with this change rather  than indicate, and if it does not will need      to work her up further for possible recurrent sarcoid, for which      she is at risk.     Allison Osborn. Allison Sires, MD, [facility]  Electronically Signed    MBW/MedQ  DD: 06/24/2006  DT: 06/24/2006  Job #: [id number]   cc:   Ernestina Penna, M.D.

## 2010-06-01 NOTE — Assessment & Plan Note (Signed)
[facility]                               PULMONARY OFFICE NOTE   NAME:Allison Osborn, Allison Osborn                      MRN:          [id number]  DATE:10/31/2005                            DOB:          December 03, 1945    HISTORY OF PRESENT ILLNESS:  The patient is a 65 year old, white female  patient of Dr. Thurston Hole with a known history of recurrent sarcoid.  The  patient presents today for an acute office visit.  The patient has been off  of prednisone over the last 6 to 8 weeks and reports she was doing well up  until the last 4 days.  She complains that her dry cough, night sweats, and  itchy plaque along her anterior shins have returned.  The patient previously  had been discussed last visit by Dr. Sherene Sires.  The patient would be possibly a  candidate for Plaquenil.  However, the patient has not started this  medication as of date.  The patient denies any problems with sputum, fever,  chest pain, orthopnea, PND.   PHYSICAL EXAM:  The patient is a pleasant female in no acute distress.  She is afebrile with stable vital signs.  O2 saturation is 96% on room air.  HEENT:  Unremarkable.  NECK:  Supple without adenopathy.  No JVD or bruits.  Lungs sounds are essentially clear without any wheezes or crackles.  CARDIAC:  Regular rate and rhythm.  ABDOMEN:  Soft.  UPPER EXTREMITIES:  Warm without any edema.  There is a slightly pinkish to  purplish plaque along the right anterior shin.   IMPRESSION AND PLAN:  Sarcoidosis with recent flare, off steroids.  The  patient will restart prednisone at 20 mg daily x1 week and then decrease  down to 10 mg.  She will return here in 2 weeks with Dr. Sherene Sires.  At that  time, may consider adding in Plaquenil in hopes to completely taper off of  prednisone all together.      ______________________________  Rubye Oaks, NP    ______________________________  Charlaine Dalton. Sherene Sires, MD, [facility]     TP/MedQ  DD:  10/31/2005  DT:   11/03/2005  Job #:  [id number]

## 2010-06-01 NOTE — Assessment & Plan Note (Signed)
[facility]                               PULMONARY OFFICE NOTE   NAME:Allison Osborn, Allison Osborn                      MRN:          [id number]  DATE:09/03/2005                            DOB:          [birth year]    HISTORY:  A 65 year old white female who has been off and on steroids for  the last 7 years and actually had been maintained off of prednisone since  December 2006, but within the last month started prednisone again because of  itchy plaques on her feet and legs that were typical of sarcoid symptoms  previously with no significant ocular or articular complaints, fever,  chills, sweats, cough, or dyspnea.   She has recently undergone ophthalmology evaluation with no evidence of  sarcoid activity.   MEDICATIONS:  Her only medications at this point are prednisone, which she  has tapered down now to 5 mg per day and Prilosec 20 mg b.i.d.   PHYSICAL EXAMINATION:  GENERAL:  She is a pleasant, ambulatory, minimally  cushingoid-appearing white female in no acute distress with stable vital  signs.  HEENT:  Unremarkable.  Oropharynx is clear.  LUNGS:  Reveal a few pops and squeaks on inspiration bilaterally, with  minimal rhonchi. Overall air movement, however, is excellent.  HEART:  Regular rhythm.  No murmur, gallops or rub.  ABDOMEN:  Soft, benign.  EXTREMITIES:  Warm.  No calf tenderness, cyanosis, clubbing, or edema.  There were purplish plaques most pronounced of the left lower extremity  anteriorly near the ankle.   IMPRESSION:  Skin involvement of sarcoid, recurrent, while maintained off of  prednisone.  I believe she would be an excellent candidate for Plaquenil and  recommend she again try to taper completely off of prednisone.  If she  flares this time with itchy plaques which are typical of her sarcoid, then I  would recommend starting Plaquenil at 200 mg per day with LFTs at 2 weeks  and 6 weeks.  If this does not eliminate the symptoms,  the other option  would be to increase to b.i.d. dosing before abandoning this and going back  with prednisone. Since this is not a life-threatening issue, but rather one  of treating the symptoms of sarcoid, I believe a more conservative approach  would be reasonable.  Followup will be with a chest x-ray in 3 months,  sooner if needed.                                   Charlaine Dalton. Sherene Sires, MD, [facility]   MBW/MedQ  DD:  09/03/2005  DT:  09/04/2005  Job #:  [id number]   cc:   Ernestina Penna, MD

## 2010-08-07 ENCOUNTER — Encounter: Payer: Self-pay | Admitting: Internal Medicine

## 2010-08-10 ENCOUNTER — Ambulatory Visit (INDEPENDENT_AMBULATORY_CARE_PROVIDER_SITE_OTHER): Payer: 59 | Admitting: Internal Medicine

## 2010-08-10 ENCOUNTER — Ambulatory Visit (INDEPENDENT_AMBULATORY_CARE_PROVIDER_SITE_OTHER)
Admission: RE | Admit: 2010-08-10 | Discharge: 2010-08-10 | Disposition: A | Discharge status: Home / Self Care | Payer: 59 | Source: Ambulatory Visit | Attending: Internal Medicine | Admitting: Internal Medicine

## 2010-08-10 ENCOUNTER — Encounter: Payer: Self-pay | Admitting: Internal Medicine

## 2010-08-10 VITALS — BP 116/76 | HR 81 | Temp 97.9°F | Ht 65.0 in | Wt 178.0 lb

## 2010-08-10 DIAGNOSIS — D869 Sarcoidosis, unspecified: Secondary | ICD-10-CM

## 2010-08-10 NOTE — Progress Notes (Signed)
Subjective:     Patient ID: Allison Osborn, female   DOB: [birth year], 65 y.o.   MRN: [id number]  HPI  36 yowf never smoker with a history of sarcoid, manifested clinically with both shin rash and cough and recurrent since diagnosis in 2000 although in retrospect she had previous parenchymal changes on cxr  suggestive of sarcoid dating back at least 5 years prior to dx.   July 14, 2009 ov p hospital discharged. from [facility] on 07/09/09 after having severe dry hacky cough and NVD. She was discharged on 07/10/09. c/o still coughing- occ prod with clear to green sputum. She also c/o fatigue better breathing after albuterol. rec dulera 100 / pred back up to 20 mg daily   July 28, 2009 ov cough is much improved. She states that she onyl has cough occ. Still feels fatigued but relates this to wt gain. still on 20 mg per day. no sob. rec dulera 100 and use ceiling of 20 a floor of 5 mg every other day   September 15, 2009 ov c/o increase cough on 5 mg every other day x 10 days and so increased to 10 mg per day and back to baseline. rec increase dulera to 200, work on hfa   December 20, 2009 ov Cough- much improved on dulera 200 doing much better with hfa technique and pred maintained at 5 mg every other day, no sob, rash, ocular or articular c/o's.   08/10/2010 ov/ Haygen Zebrowski cc no sob, no rash, cough.  Ran out of dulera x sev months with no flare in symptoms on pred 5 mg qod or need to increase prednisone  Pt denies any significant sore throat, dysphagia, itching, sneezing,  nasal congestion or excess/ purulent secretions,  fever, chills, sweats, unintended wt loss, pleuritic or exertional cp, hempoptysis, orthopnea pnd or leg swelling.    Also denies any obvious fluctuation of symptoms with weather or environmental changes or other aggravating or alleviating factors.    :  Past Medical History:  HYPERTENSION (ICD-401.9)  WEIGHT GAIN  - Target wt = 179 for BMI < 30  GERD (ICD-530.81)  SARCOIDOSIS  (ICD-135) Prednisone 2000 -2006 rash/cough...................................Marland KitchenWert/ Dorinda Hill  - Pred restarted daily 04/2007 > every third day dosing March 01, 2009 > taper off by May 14 2009 > flared by middle of May 2011 > resume prednisone May 28 2009 for sob/cough  - dulera 100 for ? airway involvement > HFA 25% July 28, 2009 > 50% September 15, 2009, rx 200 dulera > pt stopped 05/2010 s recurrent cough - HFA 90% October 30, 2009         Review of Systems     Objective:   Physical Exam : A/Ox3; pleasant & cooperative.NAD  wt 188 July 27, 2008 > 177 July 28, 2009 >  > 177 December 20, 2009 > 178 08/10/2010  HEENT: [place]/AT,, EACs-clear, TMs-wnl, NOSE-clear, THROAT-clear & wnl.  NECK: Supple w/ fair ROM; no JVD; normal carotid impulses w/o bruits; no thyromegaly or nodules palpated; no lymphadenopathy.  CHEST: a few pops and squeaks on insp, no sign wheeze on exp  HEART: RRR, no m/r/g heard  ABDOMEN: Soft & nt; nml bowel sounds; no organomegaly or masses detected.  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    Assessment:         Plan:

## 2010-08-10 NOTE — Progress Notes (Deleted)
sdf

## 2010-08-10 NOTE — Assessment & Plan Note (Signed)
The goal with a chronic steroid dependent illness is always arriving at the lowest effective dose that controls the disease/symptoms and not accepting a set "formula" which is based on statistics or guidelines that don't always take into account patient  variability or the natural hx of the dz in every individual patient, which may well vary over time.  For now therefore I recommend the patient maintain  5 mg qod  No longer apparent need for dulera - just take it if symptoms flare on qod dosing

## 2010-08-10 NOTE — Patient Instructions (Signed)
If start having problems with breathing or cough ok to restart back on dulera  Please schedule a follow up visit in 6  months but call sooner if needed

## 2010-08-13 NOTE — Progress Notes (Signed)
Quick Note:  Spoke with pt and notified of results per Dr. Wert. Pt verbalized understanding and denied any questions.  ______ 

## 2011-02-25 ENCOUNTER — Encounter: Payer: Self-pay | Admitting: Internal Medicine

## 2011-02-25 ENCOUNTER — Ambulatory Visit (INDEPENDENT_AMBULATORY_CARE_PROVIDER_SITE_OTHER): Payer: 59 | Admitting: Internal Medicine

## 2011-02-25 VITALS — BP 104/70 | HR 95 | Temp 97.8°F | Ht 65.0 in | Wt 177.0 lb

## 2011-02-25 DIAGNOSIS — D869 Sarcoidosis, unspecified: Secondary | ICD-10-CM

## 2011-02-25 MED ORDER — PREDNISONE 5 MG PO TABS: ORAL_TABLET | ORAL | Status: DC

## 2011-02-25 NOTE — Patient Instructions (Signed)
Prednisone 5 mg one half every other day if tolerated, if not go back to 5 mg every other day  Please schedule a follow up visit in 3 months but call sooner if needed with cxr

## 2011-02-25 NOTE — Assessment & Plan Note (Signed)
The goal with a chronic steroid dependent illness is always arriving at the lowest effective dose that controls the disease/symptoms and not accepting a set "formula" which is based on statistics or guidelines that don't always take into account patient  variability or the natural hx of the dz in every individual patient, which may well vary over time.  For now therefore I recommend the patient maintain  A floor of 2.5 mg per day which is physiologic and return in 3 months for f/u cxr and consider stopping  She does have some airway involvement by exam rec rx with prn dulera if symptoms to avoid going back up on systemic dose

## 2011-02-25 NOTE — Progress Notes (Signed)
Subjective:     Patient ID: Allison Osborn, female   DOB: 1945/04/25     MRN: [id number]  HPI  40 yowf never smoker with a history of sarcoid, manifested clinically with both skin rash around ankles and cough and extreme fatigue and recurrent since diagnosis in 2000 although in retrospect she had previous parenchymal changes on cxr  suggestive of sarcoid dating back at least 5 years prior to dx.   July 14, 2009 ov p hospital discharged. from [facility] on 07/09/09 after having severe dry hacky cough and NVD. She was discharged on 07/10/09. c/o still coughing- occ prod with clear to green sputum. She also c/o fatigue better breathing after albuterol. rec dulera 100 / pred back up to 20 mg daily   July 28, 2009 ov cough is much improved. She states that she onyl has cough occ. Still feels fatigued but relates this to wt gain. still on 20 mg per day. no sob. rec dulera 100 and use ceiling of 20 a floor of 5 mg every other day   September 15, 2009 ov c/o increase cough on 5 mg every other day x 10 days and so increased to 10 mg per day and back to baseline. rec increase dulera to 200, work on hfa   December 20, 2009 ov Cough- much improved on dulera 200 doing much better with hfa technique and pred maintained at 5 mg every other day, no sob, rash, ocular or articular c/o's.   08/10/2010 ov/ Jalee Saine cc no sob, no rash, cough.  Ran out of dulera x sev months with no flare in symptoms on pred 5 mg qod or need to increase prednisone rec If start having problems with breathing or cough ok to restart back on dulera  02/25/2011 f/u ov/Luther Newhouse cc doing well no flare or need for dulera on 5mg  one half qod, no articular complaints or rash, ocular complaints or sob/ cough.  Sleeping ok without nocturnal  or early am exacerbation  of respiratory  c/o's or need for noct saba. Also denies any obvious fluctuation of symptoms with weather or environmental changes or other aggravating or alleviating factors except as  outlined above       Past Medical History:  HYPERTENSION (ICD-401.9)  WEIGHT GAIN  - Target wt = 179 for BMI < 30  GERD (ICD-530.81)  SARCOIDOSIS (ICD-135) Prednisone 2000 -2006 rash/cough...................................Marland KitchenWert/ Dorinda Hill  - Pred restarted daily 04/2007 > every third day dosing March 01, 2009 > taper off by May 14 2009 > flared by middle of May 2011 > resume prednisone May 28 2009 for sob/cough  - dulera 100 for ? airway involvement > HFA 25% July 28, 2009 > 50% September 15, 2009, rx 200 dulera > pt stopped 05/2010 s recurrent cough - HFA 90% October 30, 2009              Objective:   Physical Exam A/Ox3; pleasant & cooperative.NAD  wt 188 July 27, 2008 > 177 July 28, 2009  > 178 08/10/2010 > 02/25/2011  177 HEENT: [place]/AT,, EACs-clear, TMs-wnl, NOSE-clear, THROAT-clear & wnl.  NECK: Supple w/ fair ROM; no JVD; normal carotid impulses w/o bruits; no thyromegaly or nodules palpated; no lymphadenopathy.  CHEST: a few pops and squeaks on insp, no sign wheeze on exp more so on L vs R HEART: RRR, no m/r/g heard  ABDOMEN: Soft & nt; nml bowel sounds; no organomegaly or masses detected.  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    Assessment:         Plan:

## 2011-05-27 ENCOUNTER — Other Ambulatory Visit: Payer: Self-pay | Admitting: Internal Medicine

## 2011-05-27 DIAGNOSIS — D869 Sarcoidosis, unspecified: Secondary | ICD-10-CM

## 2011-05-28 ENCOUNTER — Ambulatory Visit (INDEPENDENT_AMBULATORY_CARE_PROVIDER_SITE_OTHER)
Admission: RE | Admit: 2011-05-28 | Discharge: 2011-05-28 | Disposition: A | Discharge status: Home / Self Care | Payer: 59 | Source: Ambulatory Visit | Attending: Internal Medicine | Admitting: Internal Medicine

## 2011-05-28 ENCOUNTER — Encounter: Payer: Self-pay | Admitting: Internal Medicine

## 2011-05-28 ENCOUNTER — Ambulatory Visit (INDEPENDENT_AMBULATORY_CARE_PROVIDER_SITE_OTHER): Payer: 59 | Admitting: Internal Medicine

## 2011-05-28 VITALS — BP 138/84 | HR 96 | Temp 97.3°F | Ht 65.0 in | Wt 179.2 lb

## 2011-05-28 DIAGNOSIS — D869 Sarcoidosis, unspecified: Secondary | ICD-10-CM

## 2011-05-28 NOTE — Assessment & Plan Note (Signed)
-   Pred restarted daily 04/2007 > every third day dosing March 01, 2009 > taper off by May 14 2009 > flared by middle of May 2011 > resume prednisone May 28 2009 for sob/cough  - dulera 100 for ? airway involvement > HFA 25% July 28, 2009 > 50% September 15, 2009, rx 200 dulera > pt stopped 05/2010 s flare of symptoms - Reduced dose to 2.5 mg qod 02/25/2011  > adrenal insuff symptoms> resolved on 5 mg qod  The goal with a chronic steroid dependent illness is always arriving at the lowest effective dose that controls the disease/symptoms and not accepting a set "formula" which is based on statistics or guidelines that don't always take into account patient  variability or the natural hx of the dz in every individual patient, which may well vary over time.  For now therefore I recommend the patient maintain  A floor of 5 mg qod, not for active sarcoid, which appears to be quiescent ,  But rather for iatrogenic adrenal insufficiency, discussed in detail  In terms of pulmonary involvment cxr is unchanged though fibrotic, sh needs f/u yearly pft's but at this point doing great so no need for dulera unless cough or sob worsen.

## 2011-05-28 NOTE — Patient Instructions (Signed)
No change Prednisone 5 mg every other day  Please schedule a follow up visit in 3 months but call sooner if needed with PFT's on return

## 2011-05-28 NOTE — Progress Notes (Signed)
Subjective:     Patient ID: Allison Osborn, female   DOB: 1945/01/21     MRN: [id number]  HPI  49 yowf never smoker with a history of sarcoid, manifested clinically with both skin rash around ankles and cough and extreme fatigue and recurrent since diagnosis in 2000 although in retrospect she had previous parenchymal changes on cxr  suggestive of sarcoid dating back at least 5 years prior to dx.   July 14, 2009 ov p hospital discharged. from [facility] on 07/09/09 after having severe dry hacky cough and NVD. She was discharged on 07/10/09. c/o still coughing- occ prod with clear to green sputum. She also c/o fatigue better breathing after albuterol. rec dulera 100 / pred back up to 20 mg daily   July 28, 2009 ov cough is much improved. She states that she onyl has cough occ. Still feels fatigued but relates this to wt gain. still on 20 mg per day. no sob. rec dulera 100 and use ceiling of 20 a floor of 5 mg every other day   September 15, 2009 ov c/o increase cough on 5 mg every other day x 10 days and so increased to 10 mg per day and back to baseline. rec increase dulera to 200, work on hfa   December 20, 2009 ov Cough- much improved on dulera 200 doing much better with hfa technique and pred maintained at 5 mg every other day, no sob, rash, ocular or articular c/o's.   08/10/2010 ov/ Allison Osborn cc no sob, no rash, cough.  Ran out of dulera x sev months with no flare in symptoms on pred 5 mg qod or need to increase prednisone rec If start having problems with breathing or cough ok to restart back on dulera  02/25/2011 f/u ov/Allison Osborn cc doing well no flare or need for dulera on 5mg  one half qod, no articular complaints or rash, ocular complaints or sob/ cough. rec Prednisone 5 mg one half every other day if tolerated, if not go back to 5 mg every other day      05/28/2011 f/u ov/Allison Osborn cc fatigue and nausea on 2.5 mg qod back to 5 mg qod x one month resolved > back to baseline with no change in ex  tolerance, no cough, arthralgias, ocular or rash. Hasn't felt need for dulera at all. No overt sinus or hb symptoms.  Sleeping ok without nocturnal  or early am exacerbation  of respiratory  c/o's or need for noct saba. Also denies any obvious fluctuation of symptoms with weather or environmental changes or other aggravating or alleviating factors except as outlined above.   ROS  At present neg for  any significant sore throat, dysphagia, dental problems, itching, sneezing,  nasal congestion or excess/ purulent secretions, ear ache,   fever, chills, sweats, unintended wt loss, pleuritic or exertional cp, hemoptysis, palpitations, orthopnea pnd or leg swelling.  Also denies presyncope, palpitations, heartburn, abdominal pain, anorexia, nausea, vomiting, diarrhea  or change in bowel or urinary habits, change in stools or urine, dysuria,hematuria,  rash, arthralgias, visual complaints, headache, numbness weakness or ataxia or problems with walking or coordination. No noted change in mood/affect or memory.                        Past Medical History:  HYPERTENSION (ICD-401.9)  WEIGHT GAIN  - Target wt = 179 for BMI < 30  GERD (ICD-530.81)  SARCOIDOSIS (ICD-135) Prednisone 2000 -2006 rash/cough...................................Marland KitchenWert/ Dorinda Hill  -  Pred restarted daily 04/2007 > every third day dosing March 01, 2009 > taper off by May 14 2009 > flared by middle of May 2011 > resume prednisone May 28 2009 for sob/cough  - dulera 100 for ? airway involvement > HFA 25% July 28, 2009 > 50% September 15, 2009, rx 200 dulera > pt using prn since 05/2010 s recurrent cough - HFA 90% October 30, 2009              Objective:   Physical Exam A/Ox3; pleasant & cooperative.NAD  wt 188 July 27, 2008 > 177 July 28, 2009  > 178 08/10/2010 > 02/25/2011  177 > 05/28/2011  179 HEENT: Allison Osborn/AT,, EACs-clear, TMs-wnl, NOSE-clear, THROAT-clear & wnl.  NECK: Supple w/ fair ROM; no JVD; normal carotid impulses  w/o bruits; no thyromegaly or nodules palpated; no lymphadenopathy.  CHEST: a few pops and squeaks on insp, no sign wheeze on exp more so on L vs R HEART: RRR, no m/r/g heard  ABDOMEN: Soft & nt; nml bowel sounds; no organomegaly or masses detected.  EXT: Warm bilat, no calf pain, edema, clubbing, pulses intact     CXR  05/28/2011 :  Parenchymal opacities bilaterally are stable, consistent with changes of sarcoidosis. No active process is seen. Mediastinal contours are stable. The heart is mildly enlarged and stable. No bony abnormality is seen.  IMPRESSION: Stable chronic changes of sarcoidosis. No active process.       Assessment:         Plan:

## 2011-09-03 ENCOUNTER — Encounter: Payer: Self-pay | Admitting: Internal Medicine

## 2011-09-03 ENCOUNTER — Ambulatory Visit (INDEPENDENT_AMBULATORY_CARE_PROVIDER_SITE_OTHER): Payer: 59 | Admitting: Internal Medicine

## 2011-09-03 VITALS — BP 130/88 | HR 98 | Temp 97.8°F | Ht 65.0 in | Wt 178.0 lb

## 2011-09-03 DIAGNOSIS — J069 Acute upper respiratory infection, unspecified: Secondary | ICD-10-CM

## 2011-09-03 DIAGNOSIS — D869 Sarcoidosis, unspecified: Secondary | ICD-10-CM

## 2011-09-03 MED ORDER — TRAMADOL HCL 50 MG PO TABS: 50.0000 mg | ORAL_TABLET | Freq: Four times a day (QID) | ORAL | Status: DC | PRN

## 2011-09-03 MED ORDER — PREDNISONE 10 MG PO TABS: ORAL_TABLET | ORAL | Status: DC

## 2011-09-03 NOTE — Patient Instructions (Signed)
Dulera 200 Take 2 puffs first thing in am and then another 2 puffs about 12 hours later as needed for cough or breathing.  Work on inhaler technique:  relax and gently blow all the way out then take a nice smooth deep breath back in, triggering the inhaler at same time you start breathing in.  Hold for up to 5 seconds if you can.  Rinse and gargle with water when done   If your mouth or throat starts to bother you,   I suggest you time the inhaler to your dental care and after using the inhaler(s) brush teeth and tongue with a baking soda containing toothpaste and when you rinse this out, gargle with it first to see if this helps your mouth and throat.       Increase prednisone to 20 mg per day until back to normal, then 10 mg x 3 days, 5 mg 3 days then 5 mg every other day floor  Take delsym two tsp every 12 hours and supplement if needed with  tramadol 50 mg up to 2 every 4 hours to suppress the urge to cough. Swallowing water or using ice chips/non mint and menthol containing candies (such as lifesavers or sugarless jolly ranchers) are also effective.  You should rest your voice and avoid activities that you know make you cough.  Once you have eliminated the cough for 3 straight days try reducing the tramadol first,  then the delsym as tolerated.     Please schedule a follow up office visit in 6 weeks, call sooner if needed with PFT's same day

## 2011-09-03 NOTE — Progress Notes (Signed)
Subjective:     Patient ID: Allison Osborn, female   DOB: 27-Jun-1945     MRN: [id number]  HPI  43 yowf never smoker with a history of sarcoid, manifested clinically with both skin rash around ankles and cough and extreme fatigue and recurrent since diagnosis in 2000 although in retrospect she had previous parenchymal changes on cxr  suggestive of sarcoid dating back at least 5 years prior to dx.   July 14, 2009 ov p hospital discharged. from [facility] on 07/09/09 after having severe dry hacky cough and NVD. She was discharged on 07/10/09. c/o still coughing- occ prod with clear to green sputum. She also c/o fatigue better breathing after albuterol. rec dulera 100 / pred back up to 20 mg daily   July 28, 2009 ov cough is much improved. She states that she onyl has cough occ. Still feels fatigued but relates this to wt gain. still on 20 mg per day. no sob. rec dulera 100 and use ceiling of 20 a floor of 5 mg every other day   September 15, 2009 ov c/o increase cough on 5 mg every other day x 10 days and so increased to 10 mg per day and back to baseline. rec increase dulera to 200, work on hfa   December 20, 2009 ov Cough- much improved on dulera 200 doing much better with hfa technique and pred maintained at 5 mg every other day, no sob, rash, ocular or articular c/o's.   08/10/2010 ov/ Khaza Blansett cc no sob, no rash, cough.  Ran out of dulera x sev months with no flare in symptoms on pred 5 mg qod or need to increase prednisone rec If start having problems with breathing or cough ok to restart back on dulera  02/25/2011 f/u ov/Haeven Nickle cc doing well no flare or need for dulera on 5mg  one half qod, no articular complaints or rash, ocular complaints or sob/ cough. rec Prednisone 5 mg one half every other day if tolerated, if not go back to 5 mg every other day      05/28/2011 f/u ov/Aneka Fagerstrom cc fatigue and nausea on 2.5 mg qod back to 5 mg qod x one month resolved > back to baseline with no change in ex  tolerance, no cough, arthralgias, ocular or rash. Hasn't felt need for dulera at all. No overt sinus or hb symptoms. rec No change Prednisone 5 mg every other day    09/03/2011 f/u ov/Shakari Qazi on 5 mg qod cc abrupt onset early august sinus congestion, bilateral max sinus pain, green draiange then dry cough x 2 wks since had sinus infection rx Aug 6 omnicef  X 10 days with no more sinus problems or discolored sputum. Min sob with exertion/ one nickle sized purplish lesion R shin   Sleeping ok without nocturnal  or early am exacerbation  of respiratory  c/o's or need for noct saba. Also denies any obvious fluctuation of symptoms with weather or environmental changes or other aggravating or alleviating factors except as outlined above.   ROS  The following are not active complaints unless bolded sore throat, dysphagia, dental problems, itching, sneezing,  nasal congestion or excess/ purulent secretions, ear ache,   fever, chills, sweats, unintended wt loss, pleuritic or exertional cp, hemoptysis,  orthopnea pnd or leg swelling, presyncope, palpitations, heartburn, abdominal pain, anorexia, nausea, vomiting, diarrhea  or change in bowel or urinary habits, change in stools or urine, dysuria,hematuria,  rash, arthralgias, visual complaints, headache, numbness weakness or ataxia or  problems with walking or coordination,  change in mood/affect or memory.                            Past Medical History:  HYPERTENSION (ICD-401.9)  WEIGHT GAIN  - Target wt = 179 for BMI < 30  GERD (ICD-530.81)  SARCOIDOSIS (ICD-135) Prednisone 2000 -2006 rash/cough...................................Marland KitchenWert/ Dorinda Hill  - Pred restarted daily 04/2007 > every third day dosing March 01, 2009 > taper off by May 14 2009 > flared by middle of May 2011 > resume prednisone May 28 2009 for sob/cough  - dulera 100 for ? airway involvement > HFA 25% July 28, 2009 > 50% September 15, 2009, rx 200 dulera > pt using prn since  05/2010 s recurrent cough - HFA 75% 09/03/2011              Objective:   Physical Exam A/Ox3; pleasant & cooperative.NAD  wt 188 July 27, 2008 > 177 July 28, 2009  > 178 08/10/2010  > 05/28/2011  179 > 09/03/2011  178 HEENT: [place]/AT,, EACs-clear, TMs-wnl, NOSE-clear, THROAT-clear & wnl.  NECK: Supple w/ fair ROM; no JVD; normal carotid impulses w/o bruits; no thyromegaly or nodules palpated; no lymphadenopathy.  CHEST: a few pops and squeaks on insp, no sign wheeze on exp more so on L vs R HEART: RRR, no m/r/g heard  ABDOMEN: Soft & nt; nml bowel sounds; no organomegaly or masses detected.  EXT: Warm bilat, no calf pain, edema, clubbing, pulses intact     CXR  05/28/2011 :  Parenchymal opacities bilaterally are stable, consistent with changes of sarcoidosis. No active process is seen. Mediastinal contours are stable. The heart is mildly enlarged and stable. No bony abnormality is seen.  IMPRESSION: Stable chronic changes of sarcoidosis. No active process.       Assessment:         Plan:

## 2011-09-03 NOTE — Assessment & Plan Note (Signed)
-   Pred restarted daily 04/2007 > every third day dosing March 01, 2009 > taper off by May 14 2009 > flared by middle of May 2011 > resume prednisone May 28 2009 for sob/cough  - dulera 100 for ? airway involvement > HFA 25% July 28, 2009 > 50% September 15, 2009, rx 200 dulera > pt stopped 05/2010 s flare of symptoms - Reduced dose to 2.5 mg qod 02/25/2011  > adrenal insuff symptoms> resolved on 5 mg qod  ? Mild flare of airways dz and rash on pred 5 mg qod  The goal with a chronic steroid dependent illness is always arriving at the lowest effective dose that controls the disease/symptoms and not accepting a set "formula" which is based on statistics or guidelines that don't always take into account patient  variability or the natural hx of the dz in every individual patient, which may well vary over time.  For now therefore I recommend the patient maintain  Ceiling of 20 and floor of 5 mg qod

## 2011-09-03 NOTE — Assessment & Plan Note (Signed)
Explained natural history of uri and why it's necessary in patients at risk to treat GERD aggressively  at least  short term   to reduce risk of evolving cyclical cough initially  triggered by epithelial injury and a heightened sensitivty to the effects of any upper airway irritants,  most importantly acid - related.  That is, the more sensitive the epithelium damaged for virus, the more the cough, the more the secondary reflux (especially in those prone to reflux) the more the irritation of the sensitive mucosa and so on in a cyclical pattern.   rec use tramadol and max gerd rx to eliminate cyclical coughing

## 2011-10-25 ENCOUNTER — Ambulatory Visit: Payer: 59 | Admitting: Internal Medicine

## 2011-11-13 ENCOUNTER — Ambulatory Visit: Payer: 59 | Admitting: Internal Medicine

## 2011-12-18 ENCOUNTER — Ambulatory Visit: Payer: 59 | Admitting: Internal Medicine

## 2012-01-20 ENCOUNTER — Ambulatory Visit: Payer: 59 | Admitting: Internal Medicine

## 2012-01-24 ENCOUNTER — Ambulatory Visit (INDEPENDENT_AMBULATORY_CARE_PROVIDER_SITE_OTHER): Payer: BC Managed Care – PPO | Admitting: Internal Medicine

## 2012-01-24 ENCOUNTER — Encounter: Payer: Self-pay | Admitting: Internal Medicine

## 2012-01-24 VITALS — BP 112/78 | HR 101 | Temp 98.2°F | Ht 64.0 in | Wt 179.0 lb

## 2012-01-24 DIAGNOSIS — D869 Sarcoidosis, unspecified: Secondary | ICD-10-CM

## 2012-01-24 LAB — PULMONARY FUNCTION TEST

## 2012-01-24 NOTE — Progress Notes (Signed)
Subjective:     Patient ID: Allison Osborn, female   DOB: [birth year]     MRN: [id number]  HPI  26 yowf never smoker with a history of sarcoid, manifested clinically with both skin rash around ankles and cough and extreme fatigue and recurrent since diagnosis in 2000 although in retrospect she had previous parenchymal changes on cxr  suggestive of sarcoid dating back at least 5 years prior to dx.   July 14, 2009 ov p hospital discharged. from [facility] on 07/09/09 after having severe dry hacky cough and NVD. She was discharged on 07/10/09. c/o still coughing- occ prod with clear to green sputum. She also c/o fatigue better breathing after albuterol. rec dulera 100 / pred back up to 20 mg daily   July 28, 2009 ov cough is much improved. She states that she onyl has cough occ. Still feels fatigued but relates this to wt gain. still on 20 mg per day. no sob. rec dulera 100 and use ceiling of 20 a floor of 5 mg every other day   September 15, 2009 ov c/o increase cough on 5 mg every other day x 10 days and so increased to 10 mg per day and back to baseline. rec increase dulera to 200, work on hfa   December 20, 2009 ov Cough- much improved on dulera 200 doing much better with hfa technique and pred maintained at 5 mg every other day, no sob, rash, ocular or articular c/o's.   08/10/2010 ov/ Allison Osborn cc no sob, no rash, cough.  Ran out of dulera x sev months with no flare in symptoms on pred 5 mg qod or need to increase prednisone rec If start having problems with breathing or cough ok to restart back on dulera  02/25/2011 f/u ov/Allison Osborn cc doing well no flare or need for dulera on 5mg  one half qod, no articular complaints or rash, ocular complaints or sob/ cough. rec Prednisone 5 mg one half every other day if tolerated, if not go back to 5 mg every other day      05/28/2011 f/u ov/Allison Osborn cc fatigue and nausea on 2.5 mg qod back to 5 mg qod x one month resolved > back to baseline with no change in ex  tolerance, no cough, arthralgias, ocular or rash. Hasn't felt need for dulera at all. No overt sinus or hb symptoms. rec No change Prednisone 5 mg every other day    09/03/2011 f/u ov/Allison Osborn on 5 mg qod cc abrupt onset early august sinus congestion, bilateral max sinus pain, green draiange then dry cough x 2 wks since had sinus infection rx Aug 6 omnicef  X 10 days with no more sinus problems or discolored sputum. Min sob with exertion/ one nickle sized purplish lesion R shin rec Dulera 200 Take 2 puffs first thing in am and then another 2 puffs about 12 hours later as needed for cough or breathing.  Work on inhaler technique:  relax and gently blow all the way out then take a nice smooth deep breath back in, triggering the inhaler at same time you start breathing in.  Hold for up to 5 seconds if you can.  Rinse and gargle with water when done   If your mouth or throat starts to bother you,   I suggest you time the inhaler to your dental care and after using the inhaler(s) brush teeth and tongue with a baking soda containing toothpaste and when you rinse this out, gargle with it first  to see if this helps your mouth and throat.       Increase prednisone to 20 mg per day until back to normal, then 10 mg x 3 days, 5 mg 3 days then 5 mg every other day floor   01/24/2012 f/u ov/Allison Osborn cc all smiles, no limiting sob or cough. No obvious daytime variabilty or assoc cp or chest tightness, subjective wheeze overt sinus or hb symptoms. No unusual exp hx   Sleeping ok without nocturnal  or early am exacerbation  of respiratory  c/o's or need for noct saba. Also denies any obvious fluctuation of symptoms with weather or environmental changes or other aggravating or alleviating factors except as outlined above.   ROS  The following are not active complaints unless bolded sore throat, dysphagia, dental problems, itching, sneezing,  nasal congestion or excess/ purulent secretions, ear ache,   fever, chills,  sweats, unintended wt loss, pleuritic or exertional cp, hemoptysis,  orthopnea pnd or leg swelling, presyncope, palpitations, heartburn, abdominal pain, anorexia, nausea, vomiting, diarrhea  or change in bowel or urinary habits, change in stools or urine, dysuria,hematuria,  rash, arthralgias, visual complaints, headache, numbness weakness or ataxia or problems with walking or coordination,  change in mood/affect or memory.                            Past Medical History:  HYPERTENSION (ICD-401.9)  WEIGHT GAIN  - Target wt = 179 for BMI < 30  GERD (ICD-530.81)  SARCOIDOSIS (ICD-135) Prednisone 2000 -2006 rash/cough...................................Marland KitchenWert/ Dorinda Hill  - Pred restarted daily 04/2007 > every third day dosing March 01, 2009 > taper off by May 14 2009 > flared by middle of May 2011 > resume prednisone May 28 2009 for sob/cough  - dulera 100 for ? airway involvement > HFA 25% July 28, 2009 > 50% September 15, 2009, rx 200 dulera > pt using prn since 05/2010 s recurrent cough - HFA 75% 09/03/2011              Objective:   Physical Exam A/Ox3; pleasant & cooperative.NAD  wt 188 July 27, 2008 > 177 July 28, 2009  > 178 08/10/2010  > 05/28/2011  179 > 09/03/2011  178 > 179 01/24/2012  HEENT: [place]/AT,, EACs-clear, TMs-wnl, NOSE-clear, THROAT-clear & wnl.  NECK: Supple w/ fair ROM; no JVD; normal carotid impulses w/o bruits; no thyromegaly or nodules palpated; no lymphadenopathy.  CHEST: a few pops and squeaks on insp, no sign wheeze on exp more so on L vs R HEART: RRR, no m/r/g heard  ABDOMEN: Soft & nt; nml bowel sounds; no organomegaly or masses detected.  EXT: Warm bilat, no calf pain, edema, clubbing, pulses intact     CXR  05/28/2011 :  Parenchymal opacities bilaterally are stable, consistent with changes of sarcoidosis. No active process is seen. Mediastinal contours are stable. The heart is mildly enlarged and stable. No bony abnormality is  seen.  IMPRESSION: Stable chronic changes of sarcoidosis. No active process.       Assessment:         Plan:

## 2012-01-24 NOTE — Patient Instructions (Addendum)
If cough or short ness of breath Dulera 200  Take 2 puffs first thing in am and then another 2 puffs about 12 hours later.   Prednisone 5 mg every other day is your floor.  Return in May 2014 for cxr, sooner if needed

## 2012-01-24 NOTE — Progress Notes (Signed)
PFT done today. 

## 2012-01-25 NOTE — Assessment & Plan Note (Addendum)
-   Pred restarted daily 04/2007 > every third day dosing March 01, 2009 > taper off by May 14 2009 > flared by middle of May 2011 > resume prednisone May 28 2009 for sob/cough  - dulera 100 for ? 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

## 2012-02-06 ENCOUNTER — Other Ambulatory Visit: Payer: Self-pay | Admitting: Internal Medicine

## 2012-02-06 DIAGNOSIS — D869 Sarcoidosis, unspecified: Secondary | ICD-10-CM

## 2012-02-06 DIAGNOSIS — B701 Sparganosis: Secondary | ICD-10-CM

## 2012-02-17 ENCOUNTER — Encounter: Payer: Self-pay | Admitting: Internal Medicine

## 2012-05-20 ENCOUNTER — Telehealth: Payer: Self-pay | Admitting: Internal Medicine

## 2012-05-20 NOTE — Telephone Encounter (Signed)
Called pt x's 3 to make next ov per recall.  Pt never returned calls.  Mailed recall letter 05/20/12. Emily E McAlister °

## 2012-06-22 ENCOUNTER — Encounter: Payer: Self-pay | Admitting: Internal Medicine

## 2012-06-22 ENCOUNTER — Ambulatory Visit (INDEPENDENT_AMBULATORY_CARE_PROVIDER_SITE_OTHER)
Admission: RE | Admit: 2012-06-22 | Discharge: 2012-06-22 | Disposition: A | Discharge status: Home / Self Care | Payer: BC Managed Care – PPO | Source: Ambulatory Visit | Attending: Internal Medicine | Admitting: Internal Medicine

## 2012-06-22 ENCOUNTER — Ambulatory Visit (INDEPENDENT_AMBULATORY_CARE_PROVIDER_SITE_OTHER): Payer: BC Managed Care – PPO | Admitting: Internal Medicine

## 2012-06-22 VITALS — BP 124/82 | HR 86 | Temp 97.0°F | Ht 64.0 in | Wt 178.2 lb

## 2012-06-22 DIAGNOSIS — D869 Sarcoidosis, unspecified: Secondary | ICD-10-CM

## 2012-06-22 MED ORDER — MOMETASONE FURO-FORMOTEROL FUM 200-5 MCG/ACT IN AERO: 2.0000 | INHALATION_SPRAY | Freq: Two times a day (BID) | RESPIRATORY_TRACT | Status: DC | PRN

## 2012-06-22 NOTE — Assessment & Plan Note (Addendum)
-   Pred restarted daily 04/2007 > every third day dosing March 01, 2009 > taper off by May 14 2009 > flared by middle of May 2011 > resume prednisone May 28 2009 for sob/cough  - dulera 100 for ? airway involvement > HFA 25% July 28, 2009 > 50% September 15, 2009, rx 200 dulera > pt stopped 05/2010 s flare of symptoms - Reduced dose to 2.5 mg qod 02/25/2011  > adrenal insuff symptoms> resolved on 5 mg qod - PFT's 01/24/2012  FEV 1.28 (59%) 56 ratio and no better p B2, DLCO 83%  Adequate control on present rx, reviewed need to continue 5 mg qod and dulera 200 2bid

## 2012-06-22 NOTE — Progress Notes (Signed)
Subjective:     Patient ID: Allison Osborn, female   DOB: Jun 14, 1945     MRN: [id number]    Brief patient profile:  78 yowf never smoker with a dx of sarcoid 2000  manifested clinically with both skin rash around ankles and cough and extreme fatigue and recurrent since diagnosis  although in retrospect she had previous parenchymal changes on cxr  suggestive of sarcoid dating back at least 5 years prior to dx.   July 14, 2009 ov p hospital discharged. from [facility] on 07/09/09 after having severe dry hacky cough and NVD. She was discharged on 07/10/09. c/o still coughing- occ prod with clear to green sputum. She also c/o fatigue better breathing after albuterol. rec dulera 100 / pred back up to 20 mg daily    01/24/2012 f/u ov/Allison Osborn cc all smiles, no limiting sob or cough. rec If cough or short ness of breath Dulera 200  Take 2 puffs first thing in am and then another 2 puffs about 12 hours later.  Prednisone 5 mg every other day is your floor.   06/22/2012 f/u ov/Allison Osborn re sarcoid on 5 mg qod prednisone/ has found does better on dulera 200 2bid Chief Complaint  Patient presents with  . Follow-up    Pt states doing well and denies any new co's today.   doe x hills different from 5 years,  Has to stop about half way   No obvious daytime variabilty or assoc cough cp or chest tightness, subjective wheeze overt sinus or hb symptoms. No unusual exp hx   Sleeping ok without nocturnal  or early am exacerbation  of respiratory  c/o's or need for noct saba. Also denies any obvious fluctuation of symptoms with weather or environmental changes or other aggravating or alleviating factors except as outlined above.   Current Medications, Allergies,   Past Surgical History, Family History, and Social History were reviewed in [employer] record.  ROS  The following are not active complaints unless bolded sore throat, dysphagia, dental problems, itching, sneezing,  nasal  congestion or excess/ purulent secretions, ear ache,   fever, chills, sweats, unintended wt loss, pleuritic or exertional cp, hemoptysis,  orthopnea pnd or leg swelling, presyncope, palpitations, heartburn, abdominal pain, anorexia, nausea, vomiting, diarrhea  or change in bowel or urinary habits, change in stools or urine, dysuria,hematuria,  rash, arthralgias, visual complaints, headache, numbness weakness or ataxia or problems with walking or coordination,  change in mood/affect or memory.                  Past Medical History:  HYPERTENSION (ICD-401.9)  WEIGHT GAIN  - Target wt = 179 for BMI < 30  GERD (ICD-530.81)  SARCOIDOSIS (ICD-135) Prednisone 2000 -2006 rash/cough...................................Marland KitchenWert/ Dorinda Hill  - Pred restarted daily 04/2007 > every third day dosing March 01, 2009 > taper off by May 14 2009 > flared by middle of May 2011 > resume prednisone May 28 2009 for sob/cough  - dulera 100 for ? airway involvement > HFA 25% July 28, 2009 > 50% September 15, 2009, rx 200 dulera > pt using prn since 05/2010 s recurrent cough - HFA 75% 09/03/2011              Objective:   Physical Exam A/Ox3; pleasant & cooperative.NAD  wt 188 July 27, 2008 > 177 July 28, 2009  > 178 08/10/2010  > 05/28/2011  179 > 09/03/2011  178 > 179 01/24/2012 > 06/22/2012  178  HEENT: [place]/AT,, EACs-clear, TMs-wnl, NOSE-clear, THROAT-clear & wnl.  NECK: Supple w/ fair ROM; no JVD; normal carotid impulses w/o bruits; no thyromegaly or nodules palpated; no lymphadenopathy.  CHEST: a few pops and squeaks on insp, min exp rhonchi bilaterally  HEART: RRR, no m/r/g heard  ABDOMEN: Soft & nt; nml bowel sounds; no organomegaly or masses detected.  EXT: Warm bilat, no calf pain, edema, clubbing, pulses intact        CXR  06/22/2012 :   Stable chronic lung disease in this patient with history of sarcoidosis. No active process is seen.        Assessment:         Plan:

## 2012-06-22 NOTE — Patient Instructions (Addendum)
Continue  Dulera 200  Take 2 puffs first thing in am and then another 2 puffs about 12 hours later.   Prednisone 5 mg every other day is your floor.  Please schedule a follow up visit in 6 months but call sooner if needed

## 2012-06-24 NOTE — Progress Notes (Signed)
Quick Note:  Spoke with pt and notified of results per Dr. Wert. Pt verbalized understanding and denied any questions.  ______ 

## 2013-01-05 ENCOUNTER — Ambulatory Visit (INDEPENDENT_AMBULATORY_CARE_PROVIDER_SITE_OTHER): Payer: BC Managed Care – PPO | Admitting: Internal Medicine

## 2013-01-05 ENCOUNTER — Encounter: Payer: Self-pay | Admitting: Internal Medicine

## 2013-01-05 VITALS — BP 102/68 | HR 89 | Temp 97.5°F | Ht 64.0 in | Wt 176.0 lb

## 2013-01-05 DIAGNOSIS — D869 Sarcoidosis, unspecified: Secondary | ICD-10-CM

## 2013-01-05 MED ORDER — PREDNISONE 10 MG PO TABS: ORAL_TABLET | ORAL | Status: DC

## 2013-01-05 MED ORDER — MOMETASONE FURO-FORMOTEROL FUM 200-5 MCG/ACT IN AERO: 2.0000 | INHALATION_SPRAY | Freq: Two times a day (BID) | RESPIRATORY_TRACT | Status: DC | PRN

## 2013-01-05 NOTE — Progress Notes (Signed)
Subjective:     Patient ID: Allison Osborn, female   DOB: June 17, 1945     MRN: [id number]    Brief patient profile:  33 yowf never smoker with a dx of sarcoid 2000  manifested clinically with both skin rash around ankles and cough and extreme fatigue and recurrent since diagnosis  although in retrospect she had previous parenchymal changes on cxr  suggestive of sarcoid dating back at least 5 years prior to dx.   July 14, 2009 ov p hospital discharged. from [facility] on 07/09/09 after having severe dry hacky cough and NVD. She was discharged on 07/10/09. c/o still coughing- occ prod with clear to green sputum. She also c/o fatigue better breathing after albuterol. rec dulera 100 / pred back up to 20 mg daily    01/24/2012 f/u ov/Wert cc all smiles, no limiting sob or cough. rec If cough or short ness of breath Dulera 200  Take 2 puffs first thing in am and then another 2 puffs about 12 hours later.  Prednisone 5 mg every other day is your floor.   06/22/2012 f/u ov/Wert re sarcoid on 5 mg qod prednisone/ has found does better on dulera 200 2bid Chief Complaint  Patient presents with  . Follow-up    Pt states doing well and denies any new co's today.   doe x hills different from 5 years,  Has to stop about half way  rec Continue  Dulera 200  Take 2 puffs first thing in am and then another 2 puffs about 12 hours later.  Prednisone 5 mg every other day is your floor.   01/05/2013 f/u ov/Wert re: sarcoid  Chief Complaint  Patient presents with  . Followup Sarcoid    Pt states doing well and denies any co's today.    Sob only with hills or if gets in hurry, not a change from baseline No increase prednisone 5 mg qod is floor with no change from days on vs off   No obvious daytime variabilty or assoc cough cp or chest tightness, subjective wheeze overt sinus or hb symptoms. No unusual exp hx   Sleeping ok without nocturnal  or early am exacerbation  of respiratory  c/o's or need for  noct saba. Also denies any obvious fluctuation of symptoms with weather or environmental changes or other aggravating or alleviating factors except as outlined above.   Current Medications, Allergies,   Past Surgical History, Family History, and Social History were reviewed in [employer] record.  ROS  The following are not active complaints unless bolded sore throat, dysphagia, dental problems, itching, sneezing,  nasal congestion or excess/ purulent secretions, ear ache,   fever, chills, sweats, unintended wt loss, pleuritic or exertional cp, hemoptysis,  orthopnea pnd or leg swelling, presyncope, palpitations, heartburn, abdominal pain, anorexia, nausea, vomiting, diarrhea  or change in bowel or urinary habits, change in stools or urine, dysuria,hematuria,  rash, arthralgias, visual complaints, headache, numbness weakness or ataxia or problems with walking or coordination,  change in mood/affect or memory.                  Past Medical History:  HYPERTENSION (ICD-401.9)  WEIGHT GAIN  - Target wt = 179 for BMI < 30  GERD (ICD-530.81)  SARCOIDOSIS (ICD-135) Prednisone 2000 -2006 rash/cough...................................Marland KitchenWert/ Dorinda Hill  - Pred restarted daily 04/2007 > every third day dosing March 01, 2009 > taper off by May 14 2009 > flared by middle of May 2011 >  resume prednisone May 28 2009 for sob/cough  - dulera 100 for ? airway involvement > HFA 25% July 28, 2009 > 50% September 15, 2009, rx 200 dulera > pt using prn since 05/2010 s recurrent cough - HFA 75% 09/03/2011              Objective:   Physical Exam A/Ox3; pleasant & cooperative.NAD  wt 188 July 27, 2008 > 177 July 28, 2009  > 178 08/10/2010  > 05/28/2011  179 > 09/03/2011  178 > 179 01/24/2012 > 06/22/2012  178 > 01/05/2013  176 HEENT: [place]/AT,, EACs-clear, TMs-wnl, NOSE-clear, THROAT-clear & wnl.  NECK: Supple w/ fair ROM; no JVD; normal carotid impulses w/o bruits; no thyromegaly or nodules  palpated; no lymphadenopathy.  CHEST:  bilateral diffuse pops and squeaks on insp, min exp rhonchi bilaterally  HEART: RRR, no m/r/g heard  ABDOMEN: Soft & nt; nml bowel sounds; no organomegaly or masses detected.  EXT: Warm bilat, no calf pain, edema, clubbing, pulses intact        CXR  06/22/2012 :   Stable chronic lung disease in this patient with history of sarcoidosis. No active process is seen.        Assessment:

## 2013-01-05 NOTE — Patient Instructions (Signed)
Continue  Dulera 200  Take 2 puffs first thing in am and then another 2 puffs about 12 hours later.   Prednisone 5 mg every other day is your floor.  Please schedule a follow up visit in 6 months but call sooner if needed with cxr on return

## 2013-01-07 NOTE — Assessment & Plan Note (Addendum)
-   Pred restarted daily 04/2007 > every third day dosing March 01, 2009 > taper off by May 14 2009 > flared by middle of May 2011 > resume prednisone May 28 2009 for sob/cough  - dulera 100 for ? airway involvement > HFA 25% July 28, 2009 > 50% September 15, 2009, rx 200 dulera > pt stopped 05/2010 s flare of symptoms - Reduced dose to 2.5 mg qod 02/25/2011  > adrenal insuff symptoms> resolved on 5 mg qod - PFT's 01/24/2012  FEV 1.28 (59%) 56 ratio and no better p B2, DLCO 83%  The goal with a chronic steroid dependent illness is always arriving at the lowest effective dose that controls the disease/symptoms and not accepting a set "formula" which is based on statistics or guidelines that don't always take into account patient  variability or the natural hx of the dz in every individual patient, which may well vary over time.  For now therefore I recommend the patient maintain  A floor of 5 mg qod plus topical stroids for airway involvement with dulera  200 bid    Each maintenance medication was reviewed in detail including most importantly the difference between maintenance and as needed and under what circumstances the prns are to be used.  Please see instructions for details which were reviewed in writing and the patient given a copy.

## 2013-07-13 ENCOUNTER — Ambulatory Visit (INDEPENDENT_AMBULATORY_CARE_PROVIDER_SITE_OTHER): Payer: Medicare Other | Admitting: Internal Medicine

## 2013-07-13 ENCOUNTER — Ambulatory Visit (INDEPENDENT_AMBULATORY_CARE_PROVIDER_SITE_OTHER)
Admission: RE | Admit: 2013-07-13 | Discharge: 2013-07-13 | Disposition: A | Discharge status: Home / Self Care | Payer: Medicare Other | Source: Ambulatory Visit | Attending: Internal Medicine | Admitting: Internal Medicine

## 2013-07-13 ENCOUNTER — Encounter: Payer: Self-pay | Admitting: Internal Medicine

## 2013-07-13 VITALS — BP 132/70 | HR 90 | Temp 98.0°F | Ht 65.0 in | Wt 167.0 lb

## 2013-07-13 DIAGNOSIS — D869 Sarcoidosis, unspecified: Secondary | ICD-10-CM

## 2013-07-13 NOTE — Patient Instructions (Addendum)
No change in recommendations   Please schedule a follow up visit in 6  months but call sooner if needed    

## 2013-07-13 NOTE — Progress Notes (Signed)
Subjective:     Patient ID: Allison Osborn, female   DOB: 05-04-1945     MRN: [id number]    Brief patient profile:  [age] yowf never smoker with a dx of sarcoid 2000  manifested clinically with both skin rash around ankles and cough and extreme fatigue and recurrent since diagnosis  although in retrospect she had previous parenchymal changes on cxr  suggestive of sarcoid dating back at least 5 years prior to dx.    History of Present Illness  01/24/2012 f/u ov/Wert cc all smiles, no limiting sob or cough. rec If cough or short ness of breath Dulera 200  Take 2 puffs first thing in am and then another 2 puffs about 12 hours later.  Prednisone 5 mg every other day is your floor.   06/22/2012 f/u ov/Wert re sarcoid on 5 mg qod prednisone/ has found does better on dulera 200 2bid Chief Complaint  Patient presents with  . Follow-up    Pt states doing well and denies any new co's today.   doe x hills different from 5 years,  Has to stop about half way  rec Continue  Dulera 200  Take 2 puffs first thing in am and then another 2 puffs about 12 hours later.  Prednisone 5 mg every other day is your floor.   01/05/2013 f/u ov/Wert re: sarcoid  Chief Complaint  Patient presents with  . Followup Sarcoid    Pt states doing well and denies any co's today.    Sob only with hills or if gets in hurry, not a change from baseline No increase prednisone 5 mg qod is floor with no change from days on vs off rec Continue  Dulera 200  Take 2 puffs first thing in am and then another 2 puffs about 12 hours later.  Prednisone 5 mg every other day is your floor.   07/13/2013 f/u ov/Wert re: sarcoid 10 mg one half qod and dulera 200 2 qod  Chief Complaint  Patient presents with  . Follow-up    Pt states doing well and denies any co's today.   Not limited by breathing from desired activities       No obvious daytime variabilty or assoc cough cp or chest tightness, subjective wheeze overt sinus or hb  symptoms. No unusual exp hx   Sleeping ok without nocturnal  or early am exacerbation  of respiratory  c/o's or need for noct saba. Also denies any obvious fluctuation of symptoms with weather or environmental changes or other aggravating or alleviating factors except as outlined above.   Current Medications, Allergies,   Past Surgical History, Family History, and Social History were reviewed in [facility] electronic medical record.  ROS  The following are not active complaints unless bolded sore throat, dysphagia, dental problems, itching, sneezing,  nasal congestion or excess/ purulent secretions, ear ache,   fever, chills, sweats, unintended wt loss, pleuritic or exertional cp, hemoptysis,  orthopnea pnd or leg swelling, presyncope, palpitations, heartburn, abdominal pain, anorexia, nausea, vomiting, diarrhea  or change in bowel or urinary habits, change in stools or urine, dysuria,hematuria,  rash, arthralgias, visual complaints, headache, numbness weakness or ataxia or problems with walking or coordination,  change in mood/affect or memory.                  Past Medical History:  HYPERTENSION (ICD-401.9)  WEIGHT GAIN  - Target wt = 179 for BMI < 30  GERD (ICD-530.81)  SARCOIDOSIS (ICD-135) Prednisone 2000 -  2006 rash/cough...................................Marland Kitchen.Wert/ Dorinda HillHarrison Turner  - Pred restarted daily 04/2007 > every third day dosing March 01, 2009 > taper off by May 14 2009 > flared by middle of May 2011 > resume prednisone May 28 2009 for sob/cough  - dulera 100 for ? airway involvement > HFA 25% July 28, 2009 > 50% September 15, 2009, rx 200 dulera > pt using prn since 05/2010 s recurrent cough - HFA 75% 09/03/2011              Objective:   Physical Exam A/Ox3; pleasant & cooperative.NAD  wt 188 July 27, 2008 > 177 July 28, 2009  > 178 08/10/2010  > 05/28/2011  179 > 09/03/2011  178 > 179 01/24/2012 > 06/22/2012  178 > 01/05/2013  176 > 07/13/2013  167  HEENT: [place]/AT,,  EACs-clear, TMs-wnl, NOSE-clear, THROAT-clear & wnl.  NECK: Supple w/ fair ROM; no JVD; normal carotid impulses w/o bruits; no thyromegaly or nodules palpated; no lymphadenopathy.  CHEST:  bilateral diffuse pops and squeaks on insp, min exp rhonchi bilaterally  HEART: RRR, no m/r/g heard  ABDOMEN: Soft & nt; nml bowel sounds; no organomegaly or masses detected.  EXT: Warm bilat, no calf pain, edema, clubbing, pulses intact        CXR  07/13/2013 :  Stable chronic lung disease in a patient with history of  sarcoidosis. No active process is identified.        Assessment:

## 2013-07-15 NOTE — Assessment & Plan Note (Signed)
-   Pred restarted daily 04/2007 > every third day dosing March 01, 2009 > taper off by May 14 2009 > flared by middle of May 2011 > resume prednisone May 28 2009 for sob/cough  - dulera 100 for ? airway involvement > HFA 25% July 28, 2009 > 50% September 15, 2009, rx 200 dulera > pt stopped 05/2010 s flare of symptoms - Reduced dose to 2.5 mg qod 02/25/2011  > adrenal insuff symptoms> resolved on 5 mg qod - PFT's 01/24/2012  FEV 1.28 (59%) 56 ratio and no better p B2, DLCO 83%     No evidence of pulmonary or systemic progression though she remains dep on nearly physiologic doses of steroids    Each maintenance medication was reviewed in detail including most importantly the difference between maintenance and as needed and under what circumstances the prns are to be used.  Please see instructions for details which were reviewed in writing and the patient given a copy.

## 2013-12-16 ENCOUNTER — Encounter: Payer: Self-pay | Admitting: Internal Medicine

## 2013-12-16 ENCOUNTER — Ambulatory Visit (INDEPENDENT_AMBULATORY_CARE_PROVIDER_SITE_OTHER): Payer: Medicare Other | Admitting: Internal Medicine

## 2013-12-16 VITALS — BP 118/88 | HR 90 | Ht 64.0 in | Wt 165.6 lb

## 2013-12-16 DIAGNOSIS — D869 Sarcoidosis, unspecified: Secondary | ICD-10-CM

## 2013-12-16 DIAGNOSIS — J45991 Cough variant asthma: Secondary | ICD-10-CM

## 2013-12-16 NOTE — Progress Notes (Signed)
Subjective:     Patient ID: Allison Osborn, female   DOB: [birth year]     MRN: [id number]    Brief patient profile:  [age] yowf never smoker with a dx of sarcoid 2000  manifested clinically with both skin rash around ankles and cough and extreme fatigue and recurrent since diagnosis  although in retrospect she had previous parenchymal changes on cxr  suggestive of sarcoid dating back at least 5 years prior to dx.    History of Present Illness  01/24/2012 f/u ov/Esdras Delair cc all smiles, no limiting sob or cough. rec If cough or short ness of breath Dulera 200  Take 2 puffs first thing in am and then another 2 puffs about 12 hours later.  Prednisone 5 mg every other day is your floor.       12/16/2013 f/u ov/Tiyon Sanor re: sarcoid cough is main issue/ no better on dulera 200  and worse if on less than pred 10 one half qod  Chief Complaint  Patient presents with  . Follow-up    Pt states overall doing well. She c/o PND and is taking zyrtec and this helps.   More active overall since last ov ,  Not limited by breathing from desired activities   Cough with activity outside, not noct, not productive    No obvious daytime variabilty or assoc   cp or chest tightness, subjective wheeze overt sinus or hb symptoms. No unusual exp hx   Sleeping ok without nocturnal  or early am exacerbation  of respiratory  c/o's or need for noct saba. Also denies any obvious fluctuation of symptoms with weather or environmental changes or other aggravating or alleviating factors except as outlined above.   Current Medications, Allergies,   Past Surgical History, Family History, and Social History were reviewed in [facility] electronic medical record.  ROS  The following are not active complaints unless bolded sore throat, dysphagia, dental problems, itching, sneezing,  nasal congestion or excess/ purulent secretions, ear ache,   fever, chills, sweats, unintended wt loss, pleuritic or exertional cp, hemoptysis,   orthopnea pnd or leg swelling, presyncope, palpitations, heartburn, abdominal pain, anorexia, nausea, vomiting, diarrhea  or change in bowel or urinary habits, change in stools or urine, dysuria,hematuria,  rash, arthralgias, visual complaints, headache, numbness weakness or ataxia or problems with walking or coordination,  change in mood/affect or memory.                  Past Medical History:  HYPERTENSION (ICD-401.9)  WEIGHT GAIN  - Target wt = 179 for BMI < 30  GERD (ICD-530.81)  SARCOIDOSIS (ICD-135) Prednisone 2000 -2006 rash/cough...................................Marland Kitchen.Nimco Bivens/ Dorinda HillHarrison Turner  - Pred restarted daily 04/2007 > every third day dosing March 01, 2009 > taper off by May 14 2009 > flared by middle of May 2011 > resume prednisone May 28 2009 for sob/cough  - dulera 100 for ? airway involvement > HFA 25% July 28, 2009 > 50% September 15, 2009, rx 200 dulera > pt using prn since 05/2010 s recurrent cough - HFA 75% 09/03/2011              Objective:   Physical Exam A/Ox3; pleasant & cooperative.NAD  wt 188 July 27, 2008 > 177 July 28, 2009  > 178 08/10/2010  > 05/28/2011  179 > 09/03/2011  178 > 179 01/24/2012 > 06/22/2012  178 > 01/05/2013  176 > 07/13/2013  167> 12/16/13 166  HEENT: [place]/AT,, EACs-clear, TMs-wnl, NOSE-clear, THROAT-clear & wnl.  NECK:  Supple w/ fair ROM; no JVD; normal carotid impulses w/o bruits; no thyromegaly or nodules palpated; no lymphadenopathy.  CHEST:  bilateral diffuse pops and squeaks on insp, min exp rhonchi bilaterally  HEART: RRR, no m/r/g heard  ABDOMEN: Soft & nt; nml bowel sounds; no organomegaly or masses detected.  EXT: Warm bilat, no calf pain, edema, clubbing, pulses intact        CXR  07/13/2013 :  Stable chronic lung disease in a patient with history of  sarcoidosis. No active process is identified.        Assessment:

## 2013-12-16 NOTE — Patient Instructions (Signed)
For cough try the dulera 100 up to 2 every 12 hours to see if helps and if not ok to continue the 200   Work on inhaler technique:  relax and gently blow all the way out then take a nice smooth deep breath back in, triggering the inhaler at same time you start breathing in.  Hold for up to 5 seconds if you can.  Rinse and gargle with water when done    For drainage take chlortrimeton (chlorpheniramine) 4 mg every 4 hours available over the counter (may cause drowsiness)   Please schedule a follow up office visit in 6 weeks, call sooner if needed with cxr on return

## 2013-12-19 DIAGNOSIS — J45991 Cough variant asthma: Secondary | ICD-10-CM | POA: Insufficient documentation

## 2013-12-19 NOTE — Assessment & Plan Note (Addendum)
Main ddx is cough variant asthma vs sarcoid cough vs unrelated upper airway cough syndrome - to sep the first two from the last rec she try dulera 100 Take 2 puffs first thing in am and then another 2 puffs about 12 hours later - if flares then that would be strongly against uacs.   The proper method of use, as well as anticipated side effects, of a metered-dose inhaler are discussed and demonstrated to the patient. Improved effectiveness after extensive coaching during this visit to a level of approximately  90%

## 2013-12-19 NOTE — Assessment & Plan Note (Signed)
-   Pred restarted daily 04/2007 > every third day dosing March 01, 2009 > taper off by May 14 2009 > flared by middle of May 2011 > resume prednisone May 28 2009 for sob/cough  - dulera 100 for ? airway involvement > HFA 25% July 28, 2009 > 50% September 15, 2009, rx 200 dulera > pt stopped 05/2010 s flare of symptoms - Reduced dose to 2.5 mg qod 02/25/2011  > adrenal insuff symptoms> resolved on 5 mg qod - PFT's 01/24/2012  FEV 1.28 (59%) 56 ratio and no better p B2, DLCO 83%  The goal with a chronic steroid dependent illness is always arriving at the lowest effective dose that controls the disease/symptoms and not accepting a set "formula" which is based on statistics or guidelines that don't always take into account patient  variability or the natural hx of the dz in every individual patient, which may well vary over time.  For now therefore I recommend the patient maintain  5 mg qod  Not clear cough is sarcoid related - one way to test this is see if flares on lower dose ics (see cough a/p)

## 2014-04-20 ENCOUNTER — Other Ambulatory Visit: Payer: Self-pay | Admitting: Internal Medicine

## 2014-06-21 ENCOUNTER — Encounter: Payer: Self-pay | Admitting: Internal Medicine

## 2014-06-21 ENCOUNTER — Ambulatory Visit (INDEPENDENT_AMBULATORY_CARE_PROVIDER_SITE_OTHER)
Admission: RE | Admit: 2014-06-21 | Discharge: 2014-06-21 | Disposition: A | Discharge status: Home / Self Care | Payer: Medicare Other | Source: Ambulatory Visit | Attending: Internal Medicine | Admitting: Internal Medicine

## 2014-06-21 ENCOUNTER — Ambulatory Visit (INDEPENDENT_AMBULATORY_CARE_PROVIDER_SITE_OTHER): Payer: Medicare Other | Admitting: Internal Medicine

## 2014-06-21 DIAGNOSIS — D869 Sarcoidosis, unspecified: Secondary | ICD-10-CM | POA: Diagnosis not present

## 2014-06-21 NOTE — Patient Instructions (Signed)
Be sure to keep up with your eye appointments  Please remember to go to the   x-ray department downstairs for your tests - we will call you with the results when they are available.    Please schedule a follow up visit in 6 months but call sooner if needed

## 2014-06-21 NOTE — Assessment & Plan Note (Signed)
-   Pred restarted daily 04/2007 > every third day dosing March 01, 2009 > taper off by May 14 2009 > flared by middle of May 2011 > resume prednisone May 28 2009 for sob/cough  - dulera 100 for ? airway involvement > HFA 25% July 28, 2009 > 50% September 15, 2009, rx 200 dulera > pt stopped 05/2010 s flare of symptoms - Reduced dose to 2.5 mg qod 02/25/2011  > adrenal insuff symptoms> resolved on 5 mg qod - PFT's 01/24/2012  FEV 1.28 (59%) 56 ratio and no better p B2, DLCO 83%   I had an extended discussion with the patient reviewing all relevant studies completed to date and  lasting 15 to 20 minutes of a 25 minute visit on the following ongoing concerns:   1) no evidence of active sarcoid on near physiologic doses of prednisone with adrenal insuff symptoms previously when tapered further  2) airways dz seems better clinically despite lack of perceived benefit from dulera so ok to just keep it handy and use it if cough flares  3) pnds could be sarcoid related vs allergy best rx is zyrtec vs 1st gen H1, reviewed  4) Each maintenance medication was reviewed in detail including most importantly the difference between maintenance and as needed and under what circumstances the prns are to be used.  Please see instructions for details which were reviewed in writing and the patient given a copy.

## 2014-06-21 NOTE — Progress Notes (Signed)
Subjective:     Patient ID: Allison Osborn, female   DOB: [birth year]     MRN: [id number]    Brief patient profile:  69 yowf never smoker with a dx of sarcoid 2000  manifested clinically with both skin rash around ankles and cough and extreme fatigue and recurrent since diagnosis  although in retrospect she had previous parenchymal changes on cxr  suggestive of sarcoid dating back at least 5 years prior to dx.    History of Present Illness  01/24/2012 f/u ov/Wert cc all smiles, no limiting sob or cough. rec If cough or short ness of breath Dulera 200  Take 2 puffs first thing in am and then another 2 puffs about 12 hours later.  Prednisone 5 mg every other day is your floor.       12/16/2013 f/u ov/Wert re: sarcoid cough is main issue/ no better on dulera 200  and worse if on less than pred 10 one half qod  Chief Complaint  Patient presents with  . Follow-up    Pt states overall doing well. She c/o PND and is taking zyrtec and this helps.   More active overall since last ov ,  Not limited by breathing from desired activities   Cough with activity outside, not noct, not productive rec For cough try the dulera 100 up to 2 every 12 hours to see if helps and if not ok to continue the 200  Work on inhaler technique:    For drainage take chlortrimeton (chlorpheniramine) 4 mg every 4 hours available over the counter (may cause drowsiness)      06/21/2014 f/u ov/Wert re: sarcoid/ pred 5 mg qod Chief Complaint  Patient presents with  . Follow-up    Pt states doing well and denies any co's today.   didn't really think the dulera helped cough which resolved ? On its own but also on ppi bid and prn antihistamines   No obvious daytime variabilty or assoc sob or  cp or chest tightness, subjective wheeze overt sinus or hb symptoms. No unusual exp hx   Sleeping ok without nocturnal  or early am exacerbation  of respiratory  c/o's or need for noct saba. Also denies any obvious fluctuation of  symptoms with weather or environmental changes or other aggravating or alleviating factors except as outlined above.   Current Medications, Allergies,   Past Surgical History, Family History, and Social History were reviewed in [facility] record.  ROS  The following are not active complaints unless bolded sore throat, dysphagia, dental problems, itching, sneezing,  nasal congestion or excess/ purulent secretions, ear ache,   fever, chills, sweats, unintended wt loss, pleuritic or exertional cp, hemoptysis,  orthopnea pnd or leg swelling, presyncope, palpitations, heartburn, abdominal pain, anorexia, nausea, vomiting, diarrhea  or change in bowel or urinary habits, change in stools or urine, dysuria,hematuria,  rash, arthralgias, visual complaints, headache, numbness weakness or ataxia or problems with walking or coordination,  change in mood/affect or memory.                  Past Medical History:  HYPERTENSION (ICD-401.9)  WEIGHT GAIN  - Target wt = 179 for BMI < 30  GERD (ICD-530.81)  SARCOIDOSIS (ICD-135) Prednisone 2000 -2006 rash/cough...................................Marland KitchenWert/ Dorinda Hill  - Pred restarted daily 04/2007 > every third day dosing March 01, 2009 > taper off by May 14 2009 > flared by middle of May 2011 > resume prednisone May 28 2009 for sob/cough  -  dulera 100 for ? airway involvement > HFA 25% July 28, 2009 > 50% September 15, 2009, rx 200 dulera > pt using prn since 05/2010 s recurrent cough - HFA 75% 09/03/2011              Objective:   Physical Exam  A/Ox3; pleasant & cooperative.NAD   wt 188 July 27, 2008 > 177 July 28, 2009  > 178 08/10/2010  > 05/28/2011  179 > 09/03/2011  178 > 179 01/24/2012 > 06/22/2012  178 > 01/05/2013  176 > 07/13/2013  167> 12/16/13 166 > 06/21/2014   166  Vital signs reviewed  HEENT: /AT,, EACs-clear, TMs-wnl, NOSE-clear, THROAT-clear & wnl.  NECK: Supple w/ fair ROM; no JVD; normal carotid impulses w/o bruits;  no thyromegaly or nodules palpated; no lymphadenopathy.  CHEST:  bilateral scattered upper > lower chest  squeaks on insp  HEART: RRR, no m/r/g heard  ABDOMEN: Soft & nt; nml bowel sounds; no organomegaly or masses detected.  EXT: Warm bilat, no calf pain, edema, clubbing, pulses intact         CXR PA and Lateral:   06/21/2014 :     I personally reviewed images and agree with radiology impression as follows:     Diffuse bilateral pulmonary interstitial prominence with nodularity is again noted. 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 No interim       Assessment:

## 2014-11-11 ENCOUNTER — Telehealth: Payer: Self-pay | Admitting: Internal Medicine

## 2014-11-11 MED ORDER — PREDNISONE 10 MG PO TABS: ORAL_TABLET | ORAL | Status: DC

## 2014-11-11 NOTE — Telephone Encounter (Signed)
lmtcb X1 for pt  

## 2014-11-11 NOTE — Telephone Encounter (Signed)
[phone], pt cb

## 2014-11-11 NOTE — Telephone Encounter (Signed)
Spoke with pt, requesting refill on prednisone.  This has been sent.  Nothing further needed.

## 2014-12-20 ENCOUNTER — Ambulatory Visit (INDEPENDENT_AMBULATORY_CARE_PROVIDER_SITE_OTHER): Payer: Medicare Other | Admitting: Internal Medicine

## 2014-12-20 ENCOUNTER — Encounter: Payer: Self-pay | Admitting: Internal Medicine

## 2014-12-20 VITALS — BP 122/84 | HR 109 | Ht 64.0 in | Wt 167.2 lb

## 2014-12-20 DIAGNOSIS — J45991 Cough variant asthma: Secondary | ICD-10-CM

## 2014-12-20 DIAGNOSIS — D869 Sarcoidosis, unspecified: Secondary | ICD-10-CM | POA: Diagnosis not present

## 2014-12-20 NOTE — Assessment & Plan Note (Addendum)
-   Pred restarted daily 04/2007 > every third day dosing March 01, 2009 > taper off by May 14 2009 > flared by middle of May 2011 > resume prednisone May 28 2009 for sob/cough  - dulera 100 for ? airway involvement > HFA 25% July 28, 2009 > 50% September 15, 2009, rx 200 dulera > pt stopped 05/2010 s flare of symptoms - Reduced dose to 2.5 mg qod 02/25/2011  > adrenal insuff symptoms> resolved on 5 mg qod - PFT's 01/24/2012  FEV 1.28 (59%) 56 ratio and no better p B2, DLCO 83%  Doing great off all steroids s evidence of progression or even need for dulera  > f/u 6 m with pfts

## 2014-12-20 NOTE — Patient Instructions (Signed)
Try taking Try prilosec otc 20mg   Take 30-60 min before first meal of the day and Pepcid ac (famotidine) 20 mg one @  bedtime until cough is completely gone for at least a week without the need for cough suppression  Please schedule a follow up visit in 6  months but call sooner if needed with cxr

## 2014-12-20 NOTE — Assessment & Plan Note (Signed)
Response to ppi > dulera c/w uacs  Discussed the recent press about ppi's in the context of a statistically significant (but questionably clinically relevant) increase in CRI in pts on ppi vs h2's > bottom line is the lowest dose of ppi that controls   gerd is the right dose and if that dose is zero that's fine esp since h2's are cheaper.   rec trial of ppi qam and h2hs unless cough flares

## 2014-12-20 NOTE — Progress Notes (Signed)
Subjective:     Patient ID: Allison Osborn, female   DOB: [birth year]     MRN: [id number]    Brief patient profile:  [id number]   yowf never smoker with a dx of sarcoid 2000  manifested clinically with both skin rash around ankles and cough and extreme fatigue and recurrent since diagnosis  although in retrospect she had previous parenchymal changes on cxr  suggestive of sarcoid dating back at least 5 years prior to dx.    History of Present Illness  01/24/2012 f/u ov/Karren Newland cc all smiles, no limiting sob or cough. rec If cough or short ness of breath Dulera 200  Take 2 puffs first thing in am and then another 2 puffs about 12 hours later.  Prednisone 5 mg every other day is your floor.       12/16/2013 f/u ov/Leighanne Adolph re: sarcoid cough is main issue/ no better on dulera 200  and worse if on less than pred 10 one half qod  Chief Complaint  Patient presents with  . Follow-up    Pt states overall doing well. She c/o PND and is taking zyrtec and this helps.   More active overall since last ov ,  Not limited by breathing from desired activities   Cough with activity outside, not noct, not productive rec For cough try the dulera 100 up to 2 every 12 hours to see if helps and if not ok to continue the 200  Work on inhaler technique:    For drainage take chlortrimeton (chlorpheniramine) 4 mg every 4 hours available over the counter (may cause drowsiness)      06/21/2014 f/u ov/Twain Stenseth re: sarcoid/ pred 5 mg qod Chief Complaint  Patient presents with  . Follow-up    Pt states doing well and denies any co's today.   didn't really think the dulera helped cough which resolved ? On its own but also on ppi bid and prn antihistamines  rec Keep up with eye appts> did not do as of 12/20/2014 > reinforced     12/20/2014  f/u ov/Estuardo Frisbee re: sarcoid / chronic cough on ppi bid resolved  Chief Complaint  Patient presents with  . Follow-up    Doing well and denies any co's. She has not had to use Dulera.    No  obvious daytime variabilty or assoc sob or  cp or chest tightness, subjective wheeze overt sinus or hb symptoms. No unusual exp hx   Sleeping ok without nocturnal  or early am exacerbation  of respiratory  c/o's or need for noct saba. Also denies any obvious fluctuation of symptoms with weather or environmental changes or other aggravating or alleviating factors except as outlined above.   Current Medications, Allergies,   Past Surgical History, Family History, and Social History were reviewed in [facility] electronic medical record.  ROS  The following are not active complaints unless bolded sore throat, dysphagia, dental problems, itching, sneezing,  nasal congestion or excess/ purulent secretions, ear ache,   fever, chills, sweats, unintended wt loss, pleuritic or exertional cp, hemoptysis,  orthopnea pnd or leg swelling, presyncope, palpitations, heartburn, abdominal pain, anorexia, nausea, vomiting, diarrhea  or change in bowel or urinary habits, change in stools or urine, dysuria,hematuria,  rash, arthralgias, visual complaints, headache, numbness weakness or ataxia or problems with walking or coordination,  change in mood/affect or memory.               Past Medical History:  HYPERTENSION (ICD-401.9)  WEIGHT GAIN  -  Target wt = 179 for BMI < 30  GERD (ICD-530.81)  SARCOIDOSIS (ICD-135) Prednisone 2000 -2006 rash/cough...................................Marland KitchenWert/ Dorinda Hill  - Pred restarted daily 04/2007 > every third day dosing March 01, 2009 > taper off by May 14 2009 > flared by middle of May 2011 > resume prednisone May 28 2009 for sob/cough  - dulera 100 for ? airway involvement > HFA 25% July 28, 2009 > 50% September 15, 2009, rx 200 dulera > pt using prn since 05/2010 s recurrent cough - HFA 75% 09/03/2011              Objective:   Physical Exam  A/Ox3; pleasant & cooperative.NAD   wt 188 July 27, 2008 > 177 July 28, 2009  > 178 08/10/2010  > 05/28/2011  179 >  09/03/2011  178 > 179 01/24/2012 > 06/22/2012  178 > 01/05/2013  176 > 07/13/2013  167> 12/16/13 166 > 06/21/2014   166 > 12/20/2014   167   Vital signs reviewed  HEENT: [place]/AT,, EACs-clear, TMs-wnl, NOSE-clear, THROAT-clear & wnl.  NECK: Supple w/ fair ROM; no JVD; normal carotid impulses w/o bruits; no thyromegaly or nodules palpated; no lymphadenopathy.  CHEST:  bilateral scattered upper > lower chest  squeaks on insp - no wheeze on exp  HEART: RRR, no m/r/g heard  ABDOMEN: Soft & nt; nml bowel sounds; no organomegaly or masses detected.  EXT: Warm bilat, no calf pain, edema, clubbing, pulses intact         CXR PA and Lateral:   06/21/2014 :     I personally reviewed images and agree with radiology impression as follows:    Diffuse bilateral pulmonary interstitial prominence with nodularity is again noted. 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 No interim       Assessment:

## 2015-05-30 ENCOUNTER — Other Ambulatory Visit: Payer: Self-pay | Admitting: Internal Medicine

## 2015-06-20 ENCOUNTER — Ambulatory Visit: Payer: Medicare Other | Admitting: Internal Medicine

## 2015-08-10 ENCOUNTER — Other Ambulatory Visit: Payer: Self-pay | Admitting: Internal Medicine

## 2015-08-10 DIAGNOSIS — R0600 Dyspnea, unspecified: Secondary | ICD-10-CM

## 2015-08-11 ENCOUNTER — Encounter: Payer: Self-pay | Admitting: Internal Medicine

## 2015-08-11 ENCOUNTER — Ambulatory Visit (INDEPENDENT_AMBULATORY_CARE_PROVIDER_SITE_OTHER)
Admission: RE | Admit: 2015-08-11 | Discharge: 2015-08-11 | Disposition: A | Discharge status: Home / Self Care | Payer: Medicare Other | Source: Ambulatory Visit | Attending: Internal Medicine | Admitting: Internal Medicine

## 2015-08-11 ENCOUNTER — Ambulatory Visit (INDEPENDENT_AMBULATORY_CARE_PROVIDER_SITE_OTHER): Payer: Medicare Other | Admitting: Internal Medicine

## 2015-08-11 ENCOUNTER — Encounter (INDEPENDENT_AMBULATORY_CARE_PROVIDER_SITE_OTHER): Payer: Medicare Other | Admitting: Internal Medicine

## 2015-08-11 VITALS — BP 122/68 | HR 84 | Ht 64.0 in | Wt 157.0 lb

## 2015-08-11 DIAGNOSIS — R0600 Dyspnea, unspecified: Secondary | ICD-10-CM

## 2015-08-11 DIAGNOSIS — D869 Sarcoidosis, unspecified: Secondary | ICD-10-CM | POA: Diagnosis not present

## 2015-08-11 LAB — PULMONARY FUNCTION TEST
DL/VA % PRED: 98 %
DL/VA: 4.72 ml/min/mmHg/L
DLCO COR % PRED: 62 %
DLCO COR: 15.13 ml/min/mmHg
DLCO unc % pred: 64 %
DLCO unc: 15.62 ml/min/mmHg
FEF 25-75 Post: 0.6 L/sec
FEF 25-75 Pre: 0.5 L/sec
FEF2575-%CHANGE-POST: 18 %
FEF2575-%PRED-POST: 29 %
FEF2575-%PRED-PRE: 25 %
FEV1-%Change-Post: 6 %
FEV1-%PRED-PRE: 45 %
FEV1-%Pred-Post: 49 %
FEV1-Post: 1.14 L
FEV1-Pre: 1.07 L
FEV1FVC-%CHANGE-POST: 5 %
FEV1FVC-%Pred-Pre: 73 %
FEV6-%Change-Post: 2 %
FEV6-%PRED-PRE: 64 %
FEV6-%Pred-Post: 65 %
FEV6-PRE: 1.88 L
FEV6-Post: 1.93 L
FEV6FVC-%Change-Post: 0 %
FEV6FVC-%Pred-Post: 104 %
FEV6FVC-%Pred-Pre: 103 %
FVC-%Change-Post: 1 %
FVC-%PRED-POST: 62 %
FVC-%PRED-PRE: 62 %
FVC-POST: 1.93 L
FVC-PRE: 1.9 L
POST FEV1/FVC RATIO: 59 %
POST FEV6/FVC RATIO: 100 %
PRE FEV1/FVC RATIO: 56 %
Pre FEV6/FVC Ratio: 99 %
RV % pred: 82 %
RV: 1.77 L
TLC % pred: 72 %
TLC: 3.65 L

## 2015-08-11 NOTE — Progress Notes (Signed)
lmtcb

## 2015-08-11 NOTE — Progress Notes (Signed)
Subjective:     Patient ID: Allison Osborn, female   DOB: 26-Aug-1945     MRN: [id number]    Brief patient profile:  48   yowf never smoker with a dx of sarcoid 2000  manifested clinically with both skin rash around ankles and cough and extreme fatigue and recurrent since diagnosis  although in retrospect she had previous parenchymal changes on cxr  suggestive of sarcoid dating back at least 5 years prior to dx.    History of Present Illness  01/24/2012 f/u ov/Wert cc all smiles, no limiting sob or cough. rec If cough or short ness of breath Dulera 200  Take 2 puffs first thing in am and then another 2 puffs about 12 hours later.  Prednisone 5 mg every other day is your floor.       12/16/2013 f/u ov/Wert re: sarcoid cough is main issue/ no better on dulera 200  and worse if on less than pred 10 one half qod  Chief Complaint  Patient presents with  . Follow-up    Pt states overall doing well. She c/o PND and is taking zyrtec and this helps.   More active overall since last ov ,  Not limited by breathing from desired activities   Cough with activity outside, not noct, not productive rec For cough try the dulera 100 up to 2 every 12 hours to see if helps and if not ok to continue the 200  Work on inhaler technique:    For drainage take chlortrimeton (chlorpheniramine) 4 mg every 4 hours available over the counter (may cause drowsiness)      06/21/2014 f/u ov/Wert re: sarcoid/ pred 5 mg qod Chief Complaint  Patient presents with  . Follow-up    Pt states doing well and denies any co's today.   didn't really think the dulera helped cough which resolved ? On its own but also on ppi bid and prn antihistamines  rec Keep up with eye appts> did not do as of 12/20/2014 > reinforced     12/20/2014  f/u ov/Wert re: sarcoid / chronic cough on ppi bid resolved  Chief Complaint  Patient presents with  . Follow-up    Doing well and denies any co's. She has not had to use Dulera.    No  obvious daytime variabilty or assoc sob or  cp or chest tightness, subjective wheeze overt sinus or hb symptoms. No unusual exp hx  rec Try taking Try prilosec otc   Take 30-60 min before first meal of the day and Pepcid ac (famotidine) 20 mg one @  bedtime until cough is completely gone for at least a week without the need for cough suppression   08/11/2015  f/u ov/Wert re: sarcoid / pred 5 mg qod and prn dulera 200 (not using  in sev m) Chief Complaint  Patient presents with  . Follow-up    PFT done today. Pt c/o cough due to recent cold. Pt also c/o mild SOB with exertion. Pt denies wheeze/CP/tightness. Pt takes [facility] as needed. Pt does not have a rescue inhaler.    really Not limited by breathing from desired activities    No obvious day to day or daytime variability or assoc excess/ purulent sputum or mucus plugs or hemoptysis or cp or chest tightness, subjective wheeze or overt sinus or hb symptoms. No unusual exp hx or h/o childhood pna/ asthma or knowledge of premature birth.  Sleeping ok without nocturnal  or early am exacerbation  of respiratory  c/o's or need for noct saba. Also denies any obvious fluctuation of symptoms with weather or environmental changes or other aggravating or alleviating factors except as outlined above   Current Medications, Allergies, Complete Past Medical History, Past Surgical History, Family History, and Social History were reviewed in [facility] record.  ROS  The following are not active complaints unless bolded sore throat, dysphagia, dental problems, itching, sneezing,  nasal congestion or excess/ purulent secretions, ear ache,   fever, chills, sweats, unintended wt loss, classically pleuritic or exertional cp,  orthopnea pnd or leg swelling, presyncope, palpitations, abdominal pain, anorexia, nausea, vomiting, diarrhea  or change in bowel or bladder habits, change in stools or urine, dysuria,hematuria,  rash, arthralgias,  visual complaints, headache, numbness, weakness or ataxia or problems with walking or coordination,  change in mood/affect or memory.                    Past Medical History:  HYPERTENSION (ICD-401.9)  WEIGHT GAIN  - Target wt = 179 for BMI < 30  GERD (ICD-530.81)  SARCOIDOSIS (ICD-135) Prednisone 2000 -2006 rash/cough...................................Marland KitchenWert/ Dorinda Hill  - Pred restarted daily 04/2007 > every third day dosing March 01, 2009 > taper off by May 14 2009 > flared by middle of May 2011 > resume prednisone May 28 2009 for sob/cough  - dulera 100 for ? airway involvement > HFA 25% July 28, 2009 > 50% September 15, 2009, rx 200 dulera > pt using prn since 05/2010 s recurrent cough - HFA 75% 09/03/2011              Objective:   Physical Exam  A/Ox3; pleasant amb wf/ all smiles   wt 188 July 27, 2008 > 177 July 28, 2009  > 178 08/10/2010  > 05/28/2011  179 > 09/03/2011  178 > 179 01/24/2012 > 06/22/2012  178 > 01/05/2013  176 > 07/13/2013  167> 12/16/13 166 > 06/21/2014   166 > 12/20/2014   167 > 08/11/2015   157   Vital signs reviewed  HEENT: [place]/AT,, EACs-clear, TMs-wnl, NOSE-clear, THROAT-clear & wnl.  NECK: Supple w/ fair ROM; no JVD; normal carotid impulses w/o bruits; no thyromegaly or nodules palpated; no lymphadenopathy.  CHEST:  bilateral scattered upper > lower chest  squeaks on insp - no wheeze on exp  HEART: RRR, no m/r/g heard  ABDOMEN: Soft & nt; nml bowel sounds; no organomegaly or masses detected.  EXT: Warm bilat, no calf pain, edema, clubbing, pulses intact         CXR PA and Lateral:   08/11/2015 :    I personally reviewed images and agree with radiology impression as follows:    Chronic interstitial fibrotic changes consistent with known sarcoidosis. There has not been significant interval worsening since the previous study. Otherwise no acute cardiopulmonary abnormality.    Assessment:

## 2015-08-11 NOTE — Progress Notes (Signed)
lmom with results

## 2015-08-11 NOTE — Patient Instructions (Signed)
Please remember to go to the   x-ray department downstairs for your tests - we will call you with the results when they are available.  Please schedule a follow up visit in 12 months but call sooner if needed     

## 2015-08-13 NOTE — Assessment & Plan Note (Signed)
-   Pred restarted daily 04/2007 > every third day dosing March 01, 2009 > taper off by May 14 2009 > flared by middle of May 2011 > resume prednisone May 28 2009 for sob/cough  - dulera 100 for ? airway involvement > HFA 25% July 28, 2009 > 50% September 15, 2009, rx 200 dulera > pt stopped 05/2010 s flare of symptoms - Reduced dose to 2.5 mg qod 02/25/2011  > adrenal insuff symptoms> resolved on 5 mg qod - PFT's 01/24/2012  FEV 1.28 (59%) 56 ratio and no better p B2, DLCO 83% - PFT's  08/11/2015  FEV1 1.14 (49 % ) ratio 59  p 6 % improvement from saba p nothing prior to study with DLCO  64/62 % corrects to 98  % for alv volume    The goal with a chronic steroid dependent illness is always arriving at the lowest effective dose that controls the disease/symptoms and not accepting a set "formula" which is based on statistics or guidelines that don't always take into account patient  variability or the natural hx of the dz in every individual patient, which may well vary over time.  For now therefore I recommend the patient maintain  5 mg qod as the established floor based on adrenal symptoms at lower doses.  Encouraged to use dulera with flares of cough or sob and return if this is not effective.  I had an extended discussion with the patient reviewing all relevant studies completed to date and  lasting 15 to 20 minutes of a 25 minute visit    Each maintenance medication was reviewed in detail including most importantly the difference between maintenance and prns and under what circumstances the prns are to be triggered using an action plan format that is not reflected in the computer generated alphabetically organized AVS.    Please see instructions for details which were reviewed in writing and the patient given a copy highlighting the part that I personally wrote and discussed at today's ov.

## 2015-10-06 ENCOUNTER — Other Ambulatory Visit: Payer: Self-pay | Admitting: Internal Medicine

## 2016-08-12 ENCOUNTER — Ambulatory Visit (INDEPENDENT_AMBULATORY_CARE_PROVIDER_SITE_OTHER): Payer: Medicare Other | Admitting: Internal Medicine

## 2016-08-12 ENCOUNTER — Encounter: Payer: Self-pay | Admitting: Internal Medicine

## 2016-08-12 VITALS — BP 122/84 | HR 99 | Ht 63.25 in | Wt 158.0 lb

## 2016-08-12 DIAGNOSIS — D869 Sarcoidosis, unspecified: Secondary | ICD-10-CM | POA: Diagnosis not present

## 2016-08-12 DIAGNOSIS — J45991 Cough variant asthma: Secondary | ICD-10-CM | POA: Diagnosis not present

## 2016-08-12 MED ORDER — MOMETASONE FURO-FORMOTEROL FUM 200-5 MCG/ACT IN AERO: 2.0000 | INHALATION_SPRAY | Freq: Two times a day (BID) | RESPIRATORY_TRACT | 11 refills | Status: DC | PRN

## 2016-08-12 MED ORDER — MOMETASONE FURO-FORMOTEROL FUM 200-5 MCG/ACT IN AERO: 2.0000 | INHALATION_SPRAY | Freq: Two times a day (BID) | RESPIRATORY_TRACT | 0 refills | Status: DC | PRN

## 2016-08-12 NOTE — Patient Instructions (Addendum)
If flare cough/ wheeze/ short of breath > dulera 200 2 puffs every 12 hours as needed  Ok to leave off the pepcid but for persistent cough > restart  Try prilosec otc 20mg   Take 30-60 min before first meal of the day and Pepcid ac (famotidine) 20 mg one @  bedtime until cough is completely gone for at least a week without the need for cough suppression    Prednisone 5 mg every other day and if not improving after above measures ok to increase Prednisone to max of 20 mg per day until better then taper    Please schedule a follow up visit in 12 months but call sooner if needed with cxr on return

## 2016-08-12 NOTE — Progress Notes (Signed)
Subjective:     Patient ID: Allison Osborn, female   DOB: 11/22/1945     MRN: [id number]    Brief patient profile:  [phone]   yowf never smoker with a dx of sarcoid 2000  manifested clinically with both skin rash around ankles and cough and extreme fatigue and recurrent since diagnosis  although in retrospect she had previous parenchymal changes on cxr  suggestive of sarcoid dating back at least 5 years prior to dx.    History of Present Illness  01/24/2012 f/u ov/Milynn Quirion cc all smiles, no limiting sob or cough. rec If cough or short ness of breath Dulera 200  Take 2 puffs first thing in am and then another 2 puffs about 12 hours later.  Prednisone 5 mg every other day is your floor.       12/16/2013 f/u ov/Shanece Cochrane re: sarcoid cough is main issue/ no better on dulera 200  and worse if on less than pred 10 one half qod  Chief Complaint  Patient presents with  . Follow-up    Pt states overall doing well. She c/o PND and is taking zyrtec and this helps.   More active overall since last ov ,  Not limited by breathing from desired activities   Cough with activity outside, not noct, not productive rec For cough try the dulera 100 up to 2 every 12 hours to see if helps and if not ok to continue the 200  Work on inhaler technique:    For drainage take chlortrimeton (chlorpheniramine) 4 mg every 4 hours available over the counter (may cause drowsiness)      06/21/2014 f/u ov/Ellyssa Zagal re: sarcoid/ pred 5 mg qod Chief Complaint  Patient presents with  . Follow-up    Pt states doing well and denies any co's today.   didn't really think the dulera helped cough which resolved ? On its own but also on ppi bid and prn antihistamines  rec Keep up with eye appts> did not do as of 12/20/2014 > reinforced     12/20/2014  f/u ov/Taimi Towe re: sarcoid / chronic cough on ppi bid resolved  Chief Complaint  Patient presents with  . Follow-up    Doing well and denies any co's. She has not had to use Dulera.    No  obvious daytime variabilty or assoc sob or  cp or chest tightness, subjective wheeze overt sinus or hb symptoms. No unusual exp hx  rec Try taking Try prilosec otc 20mg   Take 30-60 min before first meal of the day and Pepcid ac (famotidine) 20 mg one @  bedtime until cough is completely gone for at least a week without the need for cough suppression   08/11/2015  f/u ov/Jeidy Hoerner re: sarcoid / pred 5 mg qod and prn dulera 200 (not using  in sev m) Chief Complaint  Patient presents with  . Follow-up    PFT done today. Pt c/o cough due to recent cold. Pt also c/o mild SOB with exertion. Pt denies wheeze/CP/tightness. [facility]. Pt does not have a rescue inhaler.    really Not limited by breathing from desired activities   rec No change rx    08/12/2016  f/u ov/Gotham Raden re:  Sarcoidosis on prn dulera but has not used x one year/ pred 5 mg qod / pepcid at breakfast  Chief Complaint  Patient presents with  . Follow-up    Breathing is doing well. She has occ wheezing. She has not had  to use Dulera.      doe = MMRC1 = can walk nl pace, flat grade, can't hurry or go uphills or steps s sob   Sleeps fine  Only took one course of pred of 20 mg over the last year and did not initiate dulera as instructed    No obvious day to day or daytime variability or assoc excess/ purulent sputum or mucus plugs or hemoptysis or cp or chest tightness, subjective wheeze or overt sinus or hb symptoms. No unusual exp hx or h/o childhood pna/ asthma or knowledge of premature birth.  Sleeping ok without nocturnal  or early am exacerbation  of respiratory  c/o's or need for noct saba. Also denies any obvious fluctuation of symptoms with weather or environmental changes or other aggravating or alleviating factors except as outlined above   Current Medications, Allergies, Complete Past Medical History, Past Surgical History, Family History, and Social History were reviewed in [facility] electronic medical  record.  ROS  The following are not active complaints unless bolded sore throat, dysphagia, dental problems, itching, sneezing,  nasal congestion or excess/ purulent secretions, ear ache,   fever, chills, sweats, unintended wt loss, classically pleuritic or exertional cp,  orthopnea pnd or leg swelling, presyncope, palpitations, abdominal pain, anorexia, nausea, vomiting, diarrhea  or change in bowel or bladder habits, change in stools or urine, dysuria,hematuria,  rash, arthralgias, visual complaints, headache, numbness, weakness or ataxia or problems with walking or coordination,  change in mood/affect or memory.                          Past Medical History:  HYPERTENSION (ICD-401.9)  WEIGHT GAIN  - Target wt = 179 for BMI < 30  GERD (ICD-530.81)  SARCOIDOSIS (ICD-135) Prednisone 2000 -2006 rash/cough...................................Marland Kitchen.Mekai Wilkinson/ Dorinda HillHarrison Turner  - Pred restarted daily 04/2007 > every third day dosing March 01, 2009 > taper off by May 14 2009 > flared by middle of May 2011 > resume prednisone May 28 2009 for sob/cough  - dulera 100 for ? airway involvement > HFA 25% July 28, 2009 > 50% September 15, 2009, rx 200 dulera > pt using prn since 05/2010 s recurrent cough - HFA 75% 09/03/2011              Objective:   Physical Exam  A/Ox3; pleasant amb wf/ all smiles   wt 188 July 27, 2008 > 177 July 28, 2009  > 178 08/10/2010  > 05/28/2011  179 > 09/03/2011  178 > 179 01/24/2012 > 06/22/2012  178 > 01/05/2013  176 > 07/13/2013  167> 12/16/13 166 > 06/21/2014   166 > 12/20/2014   167 > 08/11/2015   157 > 08/12/2016  158   Vital signs reviewed  HEENT: [place]/AT,, EACs-clear, TMs-wnl, NOSE-clear, THROAT-clear & wnl.  NECK: Supple w/ fair ROM; no JVD; normal carotid impulses w/o bruits; no thyromegaly or nodules palpated; no lymphadenopathy.  CHEST:   insp squeaks and pops R > L upper lobe, ant > post s significant exp wheeze  HEART: RRR, no m/r/g heard  ABDOMEN: Soft & nt; nml bowel  sounds; no organomegaly or masses detected.  EXT: Warm bilat, no calf pain, edema, clubbing, pulses intact       [place] cxr spring 2018 no change from previous    Assessment:

## 2016-08-12 NOTE — Assessment & Plan Note (Signed)
Trial of reduction in dulera from 200 to 100 2bid starting 12/16/13  - 08/12/2016  After extensive coaching HFA effectiveness =    75% > continue dulera 200 2bid prn plus gerd rx prn also   Despite suboptimal technique has done well x one year maint on very low dose h2 only   Based on the study from NEJM  378; 20 p 1865 (2018) in pts with mild asthma it is reasonable to usedulera 200 2bid "prn" flare in this setting and  takes advantage of the rapid onset of action but is not the same as "rescue therapy" but can be stopped once the acute symptoms have resolved and the need for rescue has been minimized (< 2 x weekly)     I had an extended discussion with the patient reviewing all relevant studies completed to date and  lasting 15 to 20 minutes of a 25 minute visit    Each maintenance medication was reviewed in detail including most importantly the difference between maintenance and prns and under what circumstances the prns are to be triggered using an action plan format that is not reflected in the computer generated alphabetically organized AVS.    Please see AVS for specific instructions unique to this visit that I personally wrote and verbalized to the the pt in detail and then reviewed with pt  by my nurse highlighting any  changes in therapy recommended at today's visit to their plan of care.

## 2016-08-12 NOTE — Assessment & Plan Note (Signed)
-   Pred restarted daily 04/2007 > every third day dosing March 01, 2009 > taper off by May 14 2009 > flared by middle of May 2011 > resume prednisone May 28 2009 for sob/cough  - dulera 100 for ? airway involvement > HFA 25% July 28, 2009 > 50% September 15, 2009, rx 200 dulera > pt stopped 05/2010 s flare of symptoms - Reduced dose to 2.5 mg qod 02/25/2011  > adrenal insuff symptoms> resolved on 5 mg qod - PFT's 01/24/2012  FEV 1.28 (59%) 56 ratio and no better p B2, DLCO 83% - PFT's  08/11/2015  FEV1 1.14 (49 % ) ratio 59  p 6 % improvement from saba p nothing prior to study with DLCO  64/62 % corrects to 98  % for alv volume    The goal with a chronic steroid dependent illness is always arriving at the lowest effective dose that controls the disease/symptoms and not accepting a set "formula" which is based on statistics or guidelines that don't always take into account patient  variability or the natural hx of the dz in every individual patient, which may well vary over time.  For now therefore I recommend the patient maintain  celing of 20 mg daily and a floor of 5 mg qod

## 2017-02-14 ENCOUNTER — Other Ambulatory Visit: Payer: Self-pay | Admitting: Internal Medicine

## 2017-06-11 ENCOUNTER — Telehealth: Payer: Self-pay | Admitting: Internal Medicine

## 2017-06-11 MED ORDER — BUDESONIDE-FORMOTEROL FUMARATE 160-4.5 MCG/ACT IN AERO: 2.0000 | INHALATION_SPRAY | Freq: Two times a day (BID) | RESPIRATORY_TRACT | 0 refills | Status: DC

## 2017-06-11 NOTE — Telephone Encounter (Signed)
Spoke with the pt  She states Dulera 200 is no longer affordable  Ins prefers Advair 268mcg/21mcg & Symbicort 160 mcg/4.45mcg Please advise, thanks!

## 2017-06-11 NOTE — Telephone Encounter (Signed)
Spoke with patient. She is aware of change to Symbicort 160. Will send this into [facility] in [place] and will remove Dulera from her list.   Nothing else needed at time of call.

## 2017-06-11 NOTE — Telephone Encounter (Signed)
symbicort 160 2bid  

## 2017-08-12 ENCOUNTER — Ambulatory Visit (INDEPENDENT_AMBULATORY_CARE_PROVIDER_SITE_OTHER)
Admission: RE | Admit: 2017-08-12 | Discharge: 2017-08-12 | Disposition: A | Discharge status: Home / Self Care | Payer: Medicare HMO | Source: Ambulatory Visit | Attending: Internal Medicine | Admitting: Internal Medicine

## 2017-08-12 ENCOUNTER — Encounter: Payer: Self-pay | Admitting: Internal Medicine

## 2017-08-12 ENCOUNTER — Ambulatory Visit: Payer: Medicare HMO | Admitting: Internal Medicine

## 2017-08-12 VITALS — BP 114/76 | HR 92 | Ht 64.0 in | Wt 150.0 lb

## 2017-08-12 DIAGNOSIS — D869 Sarcoidosis, unspecified: Secondary | ICD-10-CM

## 2017-08-12 DIAGNOSIS — J45991 Cough variant asthma: Secondary | ICD-10-CM

## 2017-08-12 MED ORDER — BUDESONIDE-FORMOTEROL FUMARATE 160-4.5 MCG/ACT IN AERO: 2.0000 | INHALATION_SPRAY | Freq: Two times a day (BID) | RESPIRATORY_TRACT | 0 refills | Status: DC

## 2017-08-12 NOTE — Progress Notes (Signed)
Subjective:     Patient ID: Allison Osborn, female   DOB: 1945-09-10     MRN: [id number]    Brief patient profile:  89  yowf never smoker with a dx of sarcoid 2000  manifested clinically with both skin rash around ankles and cough and extreme fatigue and recurrent since diagnosis  although in retrospect she had previous parenchymal changes on cxr  suggestive of sarcoid dating back at least 5 years prior to dx.    History of Present Illness  01/24/2012 f/u ov/Wert cc all smiles, no limiting sob or cough. rec If cough or short ness of breath Dulera 200  Take 2 puffs first thing in am and then another 2 puffs about 12 hours later.  Prednisone 5 mg every other day is your floor.       12/16/2013 f/u ov/Wert re: sarcoid cough is main issue/ no better on dulera 200  and worse if on less than pred 10 one half qod  Chief Complaint  Patient presents with  . Follow-up    Pt states overall doing well. She c/o PND and is taking zyrtec and this helps.   More active overall since last ov ,  Not limited by breathing from desired activities   Cough with activity outside, not noct, not productive rec For cough try the dulera 100 up to 2 every 12 hours to see if helps and if not ok to continue the 200  Work on inhaler technique:    For drainage take chlortrimeton (chlorpheniramine) 4 mg every 4 hours available over the counter (may cause drowsiness)      06/21/2014 f/u ov/Wert re: sarcoid/ pred 5 mg qod Chief Complaint  Patient presents with  . Follow-up    Pt states doing well and denies any co's today.   didn't really think the dulera helped cough which resolved ? On its own but also on ppi bid and prn antihistamines  rec Keep up with eye appts> did not do as of 12/20/2014 > reinforced     12/20/2014  f/u ov/Wert re: sarcoid / chronic cough on ppi bid resolved  Chief Complaint  Patient presents with  . Follow-up    Doing well and denies any co's. She has not had to use Dulera.    No  obvious daytime variabilty or assoc sob or  cp or chest tightness, subjective wheeze overt sinus or hb symptoms. No unusual exp hx  rec Try taking Try prilosec otc 20mg   Take 30-60 min before first meal of the day and Pepcid ac (famotidine) 20 mg one @  bedtime until cough is completely gone for at least a week without the need for cough suppression   08/11/2015  f/u ov/Wert re: sarcoid / pred 5 mg qod and prn dulera 200 (not using  in sev m) Chief Complaint  Patient presents with  . Follow-up    PFT done today. Pt c/o cough due to recent cold. Pt also c/o mild SOB with exertion. Pt denies wheeze/CP/tightness. Pt takes [facility] as needed. Pt does not have a rescue inhaler.    really Not limited by breathing from desired activities   rec No change rx    08/12/2016  f/u ov/Wert re:  Sarcoidosis on prn dulera but has not used x one year/ pred 5 mg qod / pepcid at breakfast  Chief Complaint  Patient presents with  . Follow-up    Breathing is doing well. She has occ wheezing. She has not had to  use Dulera.      doe = MMRC1 = can walk nl pace, flat grade, can't hurry or go uphills or steps s sob   Sleeps fine  Only took one course of pred of 20 mg over the last year and did not initiate dulera as instructed  rec If flare cough/ wheeze/ short of breath > dulera 200 2 puffs every 12 hours as needed Ok to leave off the pepcid but for persistent cough > restart  Try prilosec otc 20mg   Take 30-60 min before first meal of the day and Pepcid ac (famotidine) 20 mg one @  bedtime until cough is completely gone for at least a week without the need for cough suppression Prednisone 5 mg every other day and if not improving after above measures ok to increase Prednisone to max of 20 mg per day until better then taper     08/12/2017  f/u ov/Wert re: sarcoidsis on symb 160 2bid and prednionse 5mg  qod  Chief Complaint  Patient presents with  . Follow-up    cxr done today. Breathing is doing well and no new  co's.   Dyspnea:  MMRC1 = can walk nl pace, flat grade, can't hurry or go uphills or steps s sob   Cough: none Sleeping: ok SABA use: none 02: none    No obvious day to day or daytime variability or assoc excess/ purulent sputum or mucus plugs or hemoptysis or cp or chest tightness, subjective wheeze or overt sinus or hb symptoms.   Sleeping flat  without nocturnal  or early am exacerbation  of respiratory  c/o's or need for noct saba. Also denies any obvious fluctuation of symptoms with weather or environmental changes or other aggravating or alleviating factors except as outlined above   No unusual exposure hx or h/o childhood pna/ asthma or knowledge of premature birth.  Current Allergies, Complete Past Medical History, Past Surgical History, Family History, and Social History were reviewed in [facility] electronic medical record.  ROS  The following are not active complaints unless bolded Hoarseness, sore throat, dysphagia, dental problems, itching, sneezing,  nasal congestion or discharge of excess mucus or purulent secretions, ear ache,   fever, chills, sweats, unintended wt loss or wt gain, classically pleuritic or exertional cp,  orthopnea pnd or arm/hand swelling  or leg swelling, presyncope, palpitations, abdominal pain, anorexia, nausea, vomiting, diarrhea  or change in bowel habits or change in bladder habits, change in stools or change in urine, dysuria, hematuria,  rash, arthralgias, visual complaints, headache, numbness, weakness or ataxia or problems with walking or coordination,  change in mood or  memory.        Current Meds  Medication Sig  . aspirin 81 MG tablet Take 81 mg by mouth daily.  . budesonide-formoterol (SYMBICORT) 160-4.5 MCG/ACT inhaler Inhale 2 puffs into the lungs 2 (two) times daily.  . cetirizine (ZYRTEC) 10 MG tablet Take 10 mg by mouth daily as needed.    . famotidine (PEPCID) 20 MG tablet Take 20 mg by mouth daily.  . predniSONE (DELTASONE) 10 MG  tablet TAKE 1/2 TABLET BY MOUTH EVERY OTHER DAY  . telmisartan-hydrochlorothiazide (MICARDIS HCT) 80-25 MG per tablet Take 1/2 tab by mouth once daily                             Past Medical History:  HYPERTENSION (ICD-401.9)  WEIGHT GAIN  - Target wt = 179 for BMI <  30  GERD (ICD-530.81)  SARCOIDOSIS (ICD-135) Prednisone 2000 -2006 rash/cough...................................Marland KitchenWert/ Dorinda Hill  - Pred restarted daily 04/2007 > every third day dosing March 01, 2009 > taper off by May 14 2009 > flared by middle of May 2011 > resume prednisone May 28 2009 for sob/cough  - dulera 100 for ? airway involvement > HFA 25% July 28, 2009 > 50% September 15, 2009, rx 200 dulera > pt using prn since 05/2010 s recurrent cough - HFA 75% 09/03/2011              Objective:   Physical Exam  amb wf nad - all smiles  wt 188 July 27, 2008 > 177 July 28, 2009  > 178 08/10/2010  > 05/28/2011  179 > 09/03/2011  178 > 179 01/24/2012 > 06/22/2012  178 > 01/05/2013  176 > 07/13/2013  167> 12/16/13 166 > 06/21/2014   166 > 12/20/2014   167 > 08/11/2015   157 > 08/12/2016  158 >  08/12/2017  150        HEENT: nl dentition, turbinates bilaterally, and oropharynx. Nl external ear canals without cough reflex   NECK :  without JVD/Nodes/TM/ nl carotid upstrokes bilaterally   LUNGS: no acc muscle use,  Nl contour chest with a few pops/squeaks insp/ no exp wheeze or rhonchi   CV:  RRR  no s3 or murmur or increase in P2, and no edema   ABD:  soft and nontender with nl inspiratory excursion in the supine position. No bruits or organomegaly appreciated, bowel sounds nl  MS:  Nl gait/ ext warm without deformities, calf tenderness, cyanosis or clubbing No obvious joint restrictions   SKIN: warm and dry without lesions    NEURO:  alert, approp, nl sensorium with  no motor or cerebellar deficits apparent.        CXR PA and Lateral:   08/12/2017 :    I personally reviewed images and agree with radiology  impression as follows:   Diffuse chronic interstitial changes again noted consistent with the patient's known sarcoidosis. Stable cardiomegaly. No acute abnormality identified.     Assessment:

## 2017-08-12 NOTE — Patient Instructions (Addendum)
Work on inhaler technique:  relax and gently blow all the way out then take a nice smooth deep breath back in, triggering the inhaler at same time you start breathing in.  Hold for up to 5 seconds if you can. Blow out thru nose. Rinse and gargle with water when done  Try prednisone 5 mg one half every other day   Please schedule a follow up visit in 12  months but call sooner if needed

## 2017-08-17 ENCOUNTER — Encounter: Payer: Self-pay | Admitting: Internal Medicine

## 2017-08-17 NOTE — Assessment & Plan Note (Signed)
08/12/2017  After extensive coaching inhaler device  effectiveness =    75% > continue symbicort 160 2bid  Instructed critical she use the ics consistently / effectively while tapering systemic sterodis   I had an extended discussion with the patient reviewing all relevant studies completed to date and  lasting 15 to 20 minutes of a 25 minute visit    See device teaching which extended face to face time for this visit.  Each maintenance medication was reviewed in detail including emphasizing most importantly the difference between maintenance and prns and under what circumstances the prns are to be triggered using an action plan format that is not reflected in the computer generated alphabetically organized AVS which I have not found useful in most complex patients, especially with respiratory illnesses  Please see AVS for specific instructions unique to this visit that I personally wrote and verbalized to the the pt in detail and then reviewed with pt  by my nurse highlighting any  changes in therapy recommended at today's visit to their plan of care.

## 2017-08-17 NOTE — Assessment & Plan Note (Signed)
-   Pred restarted daily 04/2007 > every third day dosing March 01, 2009 > taper off by May 14 2009 > flared by middle of May 2011 > resume prednisone May 28 2009 for sob/cough  - dulera 100 for ? airway involvement > HFA 25% July 28, 2009 > 50% September 15, 2009, rx 200 dulera > pt stopped 05/2010 s flare of symptoms - Reduced dose to 2.5 mg qod 02/25/2011  > adrenal insuff symptoms> resolved on 5 mg qod - PFT's 01/24/2012  FEV 1.28 (59%) 56 ratio and no better p B2, DLCO 83% - PFT's  08/11/2015  FEV1 1.14 (49 % ) ratio 59  p 6 % improvement from saba p nothing prior to study with DLCO  64/62 % corrects to 98  % for alv volume     The goal with a chronic steroid dependent illness is always arriving at the lowest effective dose that controls the disease/symptoms and not accepting a set "formula" which is based on statistics or guidelines that don't always take into account patient  variability or the natural hx of the dz in every individual patient, which may well vary over time.  For now therefore I recommend the patient try 2.5 mg qod if tolerates and if not resume 5 mg qod

## 2017-09-22 ENCOUNTER — Other Ambulatory Visit: Payer: Self-pay | Admitting: Internal Medicine

## 2018-05-08 ENCOUNTER — Other Ambulatory Visit: Payer: Self-pay | Admitting: Internal Medicine

## 2018-08-13 ENCOUNTER — Ambulatory Visit: Payer: Medicare HMO | Admitting: Internal Medicine

## 2018-08-17 ENCOUNTER — Encounter: Payer: Self-pay | Admitting: Internal Medicine

## 2018-08-17 ENCOUNTER — Ambulatory Visit (INDEPENDENT_AMBULATORY_CARE_PROVIDER_SITE_OTHER): Payer: Medicare HMO

## 2018-08-17 ENCOUNTER — Ambulatory Visit: Payer: Medicare HMO | Admitting: Internal Medicine

## 2018-08-17 ENCOUNTER — Other Ambulatory Visit: Payer: Self-pay

## 2018-08-17 DIAGNOSIS — D869 Sarcoidosis, unspecified: Secondary | ICD-10-CM

## 2018-08-17 DIAGNOSIS — J45991 Cough variant asthma: Secondary | ICD-10-CM | POA: Diagnosis not present

## 2018-08-17 MED ORDER — BUDESONIDE-FORMOTEROL FUMARATE 160-4.5 MCG/ACT IN AERO: 2.0000 | INHALATION_SPRAY | Freq: Two times a day (BID) | RESPIRATORY_TRACT | 0 refills | Status: DC

## 2018-08-17 NOTE — Patient Instructions (Addendum)
Try prednisone 2.5 mg every 3rd day for a month then stop - call if new symptoms (cough, short of breath, poor appetite)   Check to 02 level while walking   Work on inhaler technique:  relax and gently blow all the way out then take a nice smooth deep breath back in, triggering the inhaler at same time you start breathing in.  Hold for up to 5 seconds if you can. Blow out thru nose. Rinse and gargle with water when done    Please remember to go to the  x-ray department  for your tests - we will call you with the results when they are available    Please schedule a follow up office visit in 6 weeks, call sooner if needed

## 2018-08-17 NOTE — Progress Notes (Signed)
Subjective:     Patient ID: Allison Osborn, female   DOB: [birth year]     MRN: [id number]    Brief patient profile:  89  yowf never smoker with a dx of sarcoid 2000  manifested clinically with both skin rash around ankles and cough and extreme fatigue and recurrent since diagnosis  although in retrospect she had previous parenchymal changes on cxr  suggestive of sarcoid dating back at least 5 years prior to dx.    History of Present Illness  01/24/2012 f/u ov/Chaniece Barbato cc all smiles, no limiting sob or cough. rec If cough or short ness of breath Dulera 200  Take 2 puffs first thing in am and then another 2 puffs about 12 hours later.  Prednisone 5 mg every other day is your floor.       12/16/2013 f/u ov/Katesha Eichel re: sarcoid cough is main issue/ no better on dulera 200  and worse if on less than pred 10 one half qod  Chief Complaint  Patient presents with  . Follow-up    Pt states overall doing well. She c/o PND and is taking zyrtec and this helps.   More active overall since last ov ,  Not limited by breathing from desired activities   Cough with activity outside, not noct, not productive rec For cough try the dulera 100 up to 2 every 12 hours to see if helps and if not ok to continue the 200  Work on inhaler technique:    For drainage take chlortrimeton (chlorpheniramine) 4 mg every 4 hours available over the counter (may cause drowsiness)      06/21/2014 f/u ov/Marnell Mcdaniel re: sarcoid/ pred 5 mg qod Chief Complaint  Patient presents with  . Follow-up    Pt states doing well and denies any co's today.   didn't really think the dulera helped cough which resolved ? On its own but also on ppi bid and prn antihistamines  rec Keep up with eye appts> did not do as of 12/20/2014 > reinforced     12/20/2014  f/u ov/Jhamari Markowicz re: sarcoid / chronic cough on ppi bid resolved  Chief Complaint  Patient presents with  . Follow-up    Doing well and denies any co's. She has not had to use Dulera.    No  obvious daytime variabilty or assoc sob or  cp or chest tightness, subjective wheeze overt sinus or hb symptoms. No unusual exp hx  rec Try taking Try prilosec otc 20mg   Take 30-60 min before first meal of the day and Pepcid ac (famotidine) 20 mg one @  bedtime until cough is completely gone for at least a week without the need for cough suppression      08/12/2016  f/u ov/Monie Shere re:  Sarcoidosis on prn dulera but has not used x one year/ pred 5 mg qod / pepcid at breakfast  Chief Complaint  Patient presents with  . Follow-up    Breathing is doing well. She has occ wheezing. She has not had to use Dulera.      doe = MMRC1 = can walk nl pace, flat grade, can't hurry or go uphills or steps s sob   Sleeps fine  Only took one course of pred of 20 mg over the last year and did not initiate dulera as instructed  rec If flare cough/ wheeze/ short of breath > dulera 200 2 puffs every 12 hours as needed Ok to leave off the pepcid but for persistent cough > restart  Try prilosec otc 20mg   Take 30-60 min before first meal of the day and Pepcid ac (famotidine) 20 mg one @  bedtime until cough is completely gone for at least a week without the need for cough suppression Prednisone 5 mg every other day and if not improving after above measures ok to increase Prednisone to max of 20 mg per day until better then taper     08/12/2017  f/u ov/Tricia Oaxaca re: sarcoidsis on symb 160 2bid and prednionse 5mg  qod  Chief Complaint  Patient presents with  . Follow-up    cxr done today. Breathing is doing well and no new co's.   Dyspnea:  MMRC1 = can walk nl pace, flat grade, can't hurry or go uphills or steps s sob   rec Work on inhaler technique:   Try prednisone 5 mg one half every other day    08/17/2018  f/u ov/Colleene Swarthout re: sarcoid on 5 mg one half qod  Chief Complaint  Patient presents with  . Follow-up    Breathing is doing well and no new co's.   Dyspnea:  Still = MMRC1 = can walk nl pace, flat grade, can't hurry  or go uphills or steps s sob   Cough: none Sleeping: able to lie flat / 1 pillows SABA use: none 02: none   No obvious day to day or daytime variability or assoc excess/ purulent sputum or mucus plugs or hemoptysis or cp or chest tightness, subjective wheeze or overt sinus or hb symptoms.   Sleeping as above without nocturnal  or early am exacerbation  of respiratory  c/o's or need for noct saba. Also denies any obvious fluctuation of symptoms with weather or environmental changes or other aggravating or alleviating factors except as outlined above   No unusual exposure hx or h/o childhood pna/ asthma or knowledge of premature birth.  Current Allergies, Complete Past Medical History, Past Surgical History, Family History, and Social History were reviewed in [facility] electronic medical record.  ROS  The following are not active complaints unless bolded Hoarseness, sore throat, dysphagia, dental problems, itching, sneezing,  nasal congestion or discharge of excess mucus or purulent secretions, ear ache,   fever, chills, sweats, unintended wt loss or wt gain, classically pleuritic or exertional cp,  orthopnea pnd or arm/hand swelling  or leg swelling, presyncope, palpitations, abdominal pain, anorexia, nausea, vomiting, diarrhea  or change in bowel habits or change in bladder habits, change in stools or change in urine, dysuria, hematuria,  rash, arthralgias = back/ hips , visual complaints, headache, numbness, weakness or ataxia or problems with walking or coordination,  change in mood or  memory.        Current Meds  Medication Sig  . aspirin 81 MG tablet Take 81 mg by mouth daily.  . cetirizine (ZYRTEC) 10 MG tablet Take 10 mg by mouth daily as needed.    . famotidine (PEPCID) 20 MG tablet Take 20 mg by mouth daily.  . predniSONE (DELTASONE) 10 MG tablet TAKE 1/2 TABLET BY MOUTH EVERY OTHER DAY  . SYMBICORT 160-4.5 MCG/ACT inhaler INHALE 2 PUFFS INTO THE LUNGS TWICE DAILY  .  telmisartan-hydrochlorothiazide (MICARDIS HCT) 80-25 MG per tablet Take 1/2 tab by mouth once daily                              Past Medical History:  HYPERTENSION (ICD-401.9)  WEIGHT GAIN  - Target wt = 179 for  BMI < 30  GERD (ICD-530.81)  SARCOIDOSIS (ICD-135) Prednisone 2000 -2006 rash/cough...................................Marland KitchenWert/ Sallye Lat  - Pred restarted daily 04/2007 > every third day dosing March 01, 2009 > taper off by May 14 2009 > flared by middle of May 2011 > resume prednisone May 28 2009 for sob/cough  - dulera 100 for ? airway involvement > HFA 25% July 28, 2009 > 50% September 15, 2009, rx 200 dulera > pt using prn since 05/2010 s recurrent cough - HFA 75% 09/03/2011              Objective:   Physical Exam  amb wf nad   wt 188 July 27, 2008 > 177 July 28, 2009  > 178 08/10/2010  > 05/28/2011  179 > 09/03/2011  178 > 179 01/24/2012 > 06/22/2012  178 > 01/05/2013  176 > 07/13/2013  167> 12/16/13 166 > 06/21/2014   166 > 12/20/2014   167 > 08/11/2015   157 > 08/12/2016  158 >  08/12/2017  150>  08/17/2018  143    Vital signs reviewed - Note on arrival 02 sats  95% on RA    HEENT: nl dentition, turbinates bilaterally, and oropharynx. Nl external ear canals without cough reflex   NECK :  without JVD/Nodes/TM/ nl carotid upstrokes bilaterally   LUNGS: no acc muscle use,  Nl contour chest with a few pops/squeaks on insp/ min exp rhonchi  bilaterally without cough on insp or exp maneuvers   CV:  RRR  no s3 or murmur or increase in P2, and no edema   ABD:  soft and nontender with nl inspiratory excursion in the supine position. No bruits or organomegaly appreciated, bowel sounds nl  MS:  Nl gait/ ext warm without deformities, calf tenderness, cyanosis or clubbing No obvious joint restrictions   SKIN: warm and dry without lesions    NEURO:  alert, approp, nl sensorium with  no motor or cerebellar deficits apparent.       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 Findings are in keeping with advanced fibrotic pulmonary sarcoidosis. There is no acute appearing airspace opacity    Assessment:

## 2018-08-23 ENCOUNTER — Encounter: Payer: Self-pay | Admitting: Internal Medicine

## 2018-08-23 NOTE — Assessment & Plan Note (Addendum)
Onset of symptoms around 1995 / dx 2000     - Pred restarted daily 04/2007 > every third day dosing March 01, 2009 > taper off by May 14 2009 > flared by middle of May 2011 > resume prednisone May 28 2009 for sob/cough  - dulera 100 for ? airway involvement > HFA 25% July 28, 2009 > 50% September 15, 2009, rx 200 dulera > pt stopped 05/2010 s flare of symptoms - Reduced dose to 2.5 mg qod 02/25/2011  > adrenal insuff symptoms> resolved on 5 mg qod - PFT's 01/24/2012  FEV 1.28 (59%) 56 ratio and no better p B2, DLCO 83% - PFT's  08/11/2015  FEV1 1.14 (49 % ) ratio 59  p 6 % improvement from saba p nothing prior to study with DLCO  64/62 % corrects to 98  % for alv volume  - 08/17/2018   Walked RA x one lap =  approx 250 ft slow pace > stopped due to desats to 87% (walking more than she usually does ) while on pred 2.5 mg qod    Concerned she has not been able to walk regularly or monitor sats at higher level of prednisone but rec start now as goal was to get her completely off over next 4 weeks then return and repeat the study then - call sooner if needed

## 2018-08-23 NOTE — Assessment & Plan Note (Signed)
08/12/2017    continue symbicort 160 2bid  - 08/17/2018  After extensive coaching inhaler device,  effectiveness = 75%    Adequate control on present rx, reviewed in detail with pt > no change in rx needed     I had an extended discussion with the patient   reviewing all relevant studies completed to date and  lasting 15 to 20 minutes of a 25 minute visit  which included directly observing ambulatory 02 saturation study documented in a/p section of  today's  office note.  I performed device teaching  using a teach back technique which also  extended face to face time for this visit (see above)   Each maintenance medication was reviewed in detail including most importantly the difference between maintenance and prns and under what circumstances the prns are to be triggered using an action plan format that is not reflected in the computer generated alphabetically organized AVS.     Please see AVS for specific instructions unique to this visit that I personally wrote and verbalized to the the pt in detail and then reviewed with pt  by my nurse highlighting any changes in therapy recommended at today's visit .

## 2018-09-28 ENCOUNTER — Ambulatory Visit: Payer: Medicare HMO | Admitting: Internal Medicine

## 2018-09-28 ENCOUNTER — Other Ambulatory Visit: Payer: Self-pay

## 2018-09-28 ENCOUNTER — Encounter: Payer: Self-pay | Admitting: Internal Medicine

## 2018-09-28 DIAGNOSIS — D869 Sarcoidosis, unspecified: Secondary | ICD-10-CM

## 2018-09-28 DIAGNOSIS — J45991 Cough variant asthma: Secondary | ICD-10-CM | POA: Diagnosis not present

## 2018-09-28 NOTE — Patient Instructions (Signed)
Work on inhaler technique:  relax and gently blow all the way out then take a nice smooth deep breath back in, triggering the inhaler at same time you start breathing in.  Hold for up to 5 seconds if you can. Blow out thru nose. Rinse and gargle with water when done     Call if nausea not better in about 4 weeks  Make sure you check your oxygen saturations at highest level of activity to be sure it stays over 90% and adjust upward to maintain this level if needed but remember to turn it back to previous settings when you stop (to conserve your supply).    Please schedule a follow up visit in 3 months but call sooner if needed

## 2018-09-28 NOTE — Progress Notes (Signed)
Subjective:     Patient ID: Allison Osborn, female   DOB: August 14, 1945     MRN: [id number]    Brief patient profile:  47  yowf never smoker with a dx of sarcoid 2000  manifested clinically with both skin rash around ankles and cough and extreme fatigue and recurrent since diagnosis  although in retrospect she had previous parenchymal changes on cxr  suggestive of sarcoid dating back at least 5 years prior to dx - completely off prednisone as of 09/16/2018    History of Present Illness  01/24/2012 f/u ov/Allison Osborn cc all smiles, no limiting sob or cough. rec If cough or short ness of breath Dulera 200  Take 2 puffs first thing in am and then another 2 puffs about 12 hours later.  Prednisone 5 mg every other day is your floor.       12/16/2013 f/u ov/Allison Osborn re: sarcoid cough is main issue/ no better on dulera 200  and worse if on less than pred 10 one half qod  Chief Complaint  Patient presents with  . Follow-up    Pt states overall doing well. She c/o PND and is taking zyrtec and this helps.   More active overall since last ov ,  Not limited by breathing from desired activities   Cough with activity outside, not noct, not productive rec For cough try the dulera 100 up to 2 every 12 hours to see if helps and if not ok to continue the 200  Work on inhaler technique:    For drainage take chlortrimeton (chlorpheniramine) 4 mg every 4 hours available over the counter (may cause drowsiness)      06/21/2014 f/u ov/Allison Osborn re: sarcoid/ pred 5 mg qod Chief Complaint  Patient presents with  . Follow-up    Pt states doing well and denies any co's today.   didn't really think the dulera helped cough which resolved ? On its own but also on ppi bid and prn antihistamines  rec Keep up with eye appts> did not do as of 12/20/2014 > reinforced     12/20/2014  f/u ov/Allison Osborn re: sarcoid / chronic cough on ppi bid resolved  Chief Complaint  Patient presents with  . Follow-up    Doing well and denies any co's.  She has not had to use Dulera.    No obvious daytime variabilty or assoc sob or  cp or chest tightness, subjective wheeze overt sinus or hb symptoms. No unusual exp hx  rec Try taking Try prilosec otc 20mg   Take 30-60 min before first meal of the day and Pepcid ac (famotidine) 20 mg one @  bedtime until cough is completely gone for at least a week without the need for cough suppression      08/12/2016  f/u ov/Allison Osborn re:  Sarcoidosis on prn dulera but has not used x one year/ pred 5 mg qod / pepcid at breakfast  Chief Complaint  Patient presents with  . Follow-up    Breathing is doing well. She has occ wheezing. She has not had to use Dulera.      doe = MMRC1 = can walk nl pace, flat grade, can't hurry or go uphills or steps s sob   Sleeps fine  Only took one course of pred of 20 mg over the last year and did not initiate dulera as instructed  rec If flare cough/ wheeze/ short of breath > dulera 200 2 puffs every 12 hours as needed Ok to leave off the  pepcid but for persistent cough > restart  Try prilosec otc 20mg   Take 30-60 min before first meal of the day and Pepcid ac (famotidine) 20 mg one @  bedtime until cough is completely gone for at least a week without the need for cough suppression Prednisone 5 mg every other day and if not improving after above measures ok to increase Prednisone to max of 20 mg per day until better then taper     08/12/2017  f/u ov/Allison Osborn re: sarcoidsis on symb 160 2bid and prednionse 5mg  qod  Chief Complaint  Patient presents with  . Follow-up    cxr done today. Breathing is doing well and no new co's.   Dyspnea:  MMRC1 = can walk nl pace, flat grade, can't hurry or go uphills or steps s sob   rec Work on inhaler technique:   Try prednisone 5 mg one half every other day    08/17/2018  f/u ov/Allison Osborn re: sarcoid on 5 mg one half qod  Chief Complaint  Patient presents with  . Follow-up    Breathing is doing well and no new co's.   Dyspnea:  Still = MMRC1 = can  walk nl pace, flat grade, can't hurry or go uphills or steps s sob   Cough: none Sleeping: able to lie flat / 1 pillows rec Try prednisone 2.5 mg every 3rd day for a month then stop - call if new symptoms (cough, short of breath, poor appetite)      09/28/2018  f/u ov/Allison Osborn re: sarcoid with likely airway involvement  Dyspnea:  MMRC1 = can walk nl pace, flat grade, can't hurry or go uphills or steps s sob   Cough: minimal daytime non prod/sporadic  Sleeping: ok on side / bed if flat  SABA use: none  02: none    No obvious day to day or daytime variability or assoc excess/ purulent sputum or mucus plugs or hemoptysis or cp or chest tightness, subjective wheeze or overt sinus or hb symptoms.   Sleeping as above  without nocturnal  or early am exacerbation  of respiratory  c/o's or need for noct saba. Also denies any obvious fluctuation of symptoms with weather or environmental changes or other aggravating or alleviating factors except as outlined above   No unusual exposure hx or h/o childhood pna/ asthma or knowledge of premature birth.  Current Allergies, Complete Past Medical History, Past Surgical History, Family History, and Social History were reviewed in [facility] electronic medical record.  ROS  The following are not active complaints unless bolded Hoarseness, sore throat, dysphagia, dental problems, itching, sneezing,  nasal congestion or discharge of excess mucus or purulent secretions, ear ache,   fever, chills, sweats, unintended wt loss or wt gain, classically pleuritic or exertional cp,  orthopnea pnd or arm/hand swelling  or leg swelling, presyncope, palpitations, abdominal pain, anorexia, nausea improving vomiting, diarrhea  or change in bowel habits or change in bladder habits, change in stools or change in urine, dysuria, hematuria,  rash, arthralgias, visual complaints, headache, numbness, weakness improving  or ataxia or problems with walking or coordination,  change in  mood or  memory.        Current Meds  Medication Sig  . aspirin 81 MG tablet Take 81 mg by mouth daily.  . budesonide-formoterol (SYMBICORT) 160-4.5 MCG/ACT inhaler Inhale 2 puffs into the lungs 2 (two) times daily.  . cetirizine (ZYRTEC) 10 MG tablet Take 10 mg by mouth daily as needed.    .Marland Kitchen  famotidine (PEPCID) 20 MG tablet Take 20 mg by mouth daily.  Marland Kitchen telmisartan-hydrochlorothiazide (MICARDIS HCT) 80-25 MG per tablet Take 1/2 tab by mouth once daily                           Past Medical History:  HYPERTENSION (ICD-401.9)  WEIGHT GAIN  - Target wt = 179 for BMI < 30  GERD (ICD-530.81)  SARCOIDOSIS (ICD-135) Prednisone 2000 -2006 rash/cough...................................Marland KitchenWert/ Sallye Lat  - Pred restarted daily 04/2007 > every third day dosing March 01, 2009 > taper off by May 14 2009 > flared by middle of May 2011 > resume prednisone May 28 2009 for sob/cough  - dulera 100 for ? airway involvement > HFA 25% July 28, 2009 > 50% September 15, 2009, rx 200 dulera > pt using prn since 05/2010 s recurrent cough - HFA 75% 09/03/2011              Objective:   Physical Exam  amb pleasant wf nad   wt 188 July 27, 2008 > 177 July 28, 2009  > 178 08/10/2010  > 05/28/2011  179 > 09/03/2011  178 > 179 01/24/2012 > 06/22/2012  178 > 01/05/2013  176 > 07/13/2013  167> 12/16/13 166 > 06/21/2014   166 > 12/20/2014   167 > 08/11/2015   157 > 08/12/2016  158 >  08/12/2017  150>  08/17/2018  143 > 09/28/2018  143    Vital signs reviewed - Note on arrival 02 sats  98% on RA   HEENT : pt wearing mask not removed for exam due to covid -19 concerns.    NECK :  without JVD/Nodes/TM/ nl carotid upstrokes bilaterally   LUNGS: no acc muscle use,  Nl contour chest with a few pops and squeaks on insp   bilaterally without exp wheeze cough on insp or exp maneuvers   CV:  RRR  no s3 or murmur or increase in P2, and no edema   ABD:  soft and nontender with nl inspiratory excursion in the supine  position. No bruits or organomegaly appreciated, bowel sounds nl  MS:  Nl gait/ ext warm without deformities, calf tenderness, cyanosis or clubbing No obvious joint restrictions   SKIN: warm and dry without lesions    NEURO:  alert, approp, nl sensorium with  no motor or cerebellar deficits apparent.                Assessment:

## 2018-09-30 ENCOUNTER — Encounter: Payer: Self-pay | Admitting: Internal Medicine

## 2018-09-30 NOTE — Assessment & Plan Note (Signed)
Onset of symptoms around 1995 / dx 2000     - Pred restarted daily 04/2007 > every third day dosing March 01, 2009 > taper off by May 14 2009 > flared by middle of May 2011 > resume prednisone May 28 2009 for sob/cough  - dulera 100 for ? airway involvement > HFA 25% July 28, 2009 > 50% September 15, 2009, rx 200 dulera > pt stopped 05/2010 s flare of symptoms - Reduced dose to 2.5 mg qod 02/25/2011  > adrenal insuff symptoms> resolved on 5 mg qod - PFT's 01/24/2012  FEV 1.28 (59%) 56 ratio and no better p B2, DLCO 83% - PFT's  08/11/2015  FEV1 1.14 (49 % ) ratio 59  p 6 % improvement from saba p nothing prior to study with DLCO  64/62 % corrects to 98  % for alv volume   - 08/17/2018   Walked RA x one lap =  approx 250 ft slow pace > stopped due to desats to 87% (walking more than she usually does ) while on pred 2.5 mg qod > d/c completely 09/15/2018  The goal with a chronic steroid dependent illness is always arriving at the lowest effective dose that controls the disease/symptoms and not accepting a set "formula" which is based on statistics or guidelines that don't always take into account patient  variability or the natural hx of the dz in every individual patient, which may well vary over time.  For now therefore I recommend the patient maintain  Off pred for now but warned extensively re restart up to 20 mg daily for any acute illness eg virus or trauma - if continues to have nausea will likely need Hydrocortisone in physiologic doses as this would suggest HPA axis not recovering full activity

## 2018-09-30 NOTE — Assessment & Plan Note (Signed)
08/12/2017    continue symbicort 160 2bid       - 09/28/2018  After extensive coaching inhaler device,  effectiveness =    75%  All goals of chronic asthma control met including optimal function and elimination of symptoms with minimal need for rescue therapy.  Contingencies discussed in full including contacting this office immediately if not controlling the symptoms using the rule of two's.     I had an extended discussion with the patient reviewing all relevant studies completed to date and  lasting 15 to 20 minutes of a 25 minute visit    I performed detailed device teaching using a teach back method which extended face to face time for this visit (see above)  Each maintenance medication was reviewed in detail including emphasizing most importantly the difference between maintenance and prns and under what circumstances the prns are to be triggered using an action plan format that is not reflected in the computer generated alphabetically organized AVS which I have not found useful in most complex patients, especially with respiratory illnesses  Please see AVS for specific instructions unique to this visit that I personally wrote and verbalized to the the pt in detail and then reviewed with pt  by my nurse highlighting any  changes in therapy recommended at today's visit to their plan of care.

## 2018-12-28 ENCOUNTER — Ambulatory Visit: Payer: Medicare HMO | Admitting: Internal Medicine

## 2019-01-19 ENCOUNTER — Encounter: Payer: Self-pay | Admitting: Internal Medicine

## 2019-01-19 ENCOUNTER — Ambulatory Visit: Payer: Medicare HMO | Admitting: Internal Medicine

## 2019-01-19 ENCOUNTER — Other Ambulatory Visit: Payer: Self-pay

## 2019-01-19 DIAGNOSIS — D869 Sarcoidosis, unspecified: Secondary | ICD-10-CM

## 2019-01-19 NOTE — Progress Notes (Signed)
Subjective:     Patient ID: Allison Osborn, female   DOB: 02-Aug-1945     MRN: [id number]    Brief patient profile:  24  yowf never smoker with a dx of sarcoid 2000  manifested clinically with both skin rash around ankles and cough and extreme fatigue and recurrent since diagnosis  although in retrospect she had previous parenchymal changes on cxr  suggestive of sarcoid dating back at least 5 years prior to dx - completely off prednisone as of 09/16/2018    History of Present Illness  01/24/2012 f/u ov/Vaniyah Lansky cc all smiles, no limiting sob or cough. rec If cough or short ness of breath Dulera 200  Take 2 puffs first thing in am and then another 2 puffs about 12 hours later.  Prednisone 5 mg every other day is your floor.       12/16/2013 f/u ov/Melquiades Kovar re: sarcoid cough is main issue/ no better on dulera 200  and worse if on less than pred 10 one half qod  Chief Complaint  Patient presents with  . Follow-up    Pt states overall doing well. She c/o PND and is taking zyrtec and this helps.   More active overall since last ov ,  Not limited by breathing from desired activities   Cough with activity outside, not noct, not productive rec For cough try the dulera 100 up to 2 every 12 hours to see if helps and if not ok to continue the 200  Work on inhaler technique:    For drainage take chlortrimeton (chlorpheniramine) 4 mg every 4 hours available over the counter (may cause drowsiness)      06/21/2014 f/u ov/Daylani Deblois re: sarcoid/ pred 5 mg qod Chief Complaint  Patient presents with  . Follow-up    Pt states doing well and denies any co's today.   didn't really think the dulera helped cough which resolved ? On its own but also on ppi bid and prn antihistamines  rec Keep up with eye appts> did not do as of 12/20/2014 > reinforced     12/20/2014  f/u ov/Kori Goins re: sarcoid / chronic cough on ppi bid resolved  Chief Complaint  Patient presents with  . Follow-up    Doing well and denies any co's.  She has not had to use Dulera.    No obvious daytime variabilty or assoc sob or  cp or chest tightness, subjective wheeze overt sinus or hb symptoms. No unusual exp hx  rec Try taking Try prilosec otc 20mg   Take 30-60 min before first meal of the day and Pepcid ac (famotidine) 20 mg one @  bedtime until cough is completely gone for at least a week without the need for cough suppression      08/12/2016  f/u ov/Noble Bodie re:  Sarcoidosis on prn dulera but has not used x one year/ pred 5 mg qod / pepcid at breakfast  Chief Complaint  Patient presents with  . Follow-up    Breathing is doing well. She has occ wheezing. She has not had to use Dulera.      doe = MMRC1 = can walk nl pace, flat grade, can't hurry or go uphills or steps s sob   Sleeps fine  Only took one course of pred of 20 mg over the last year and did not initiate dulera as instructed  rec If flare cough/ wheeze/ short of breath > dulera 200 2 puffs every 12 hours as needed Ok to leave off the  pepcid but for persistent cough > restart  Try prilosec otc 20mg   Take 30-60 min before first meal of the day and Pepcid ac (famotidine) 20 mg one @  bedtime until cough is completely gone for at least a week without the need for cough suppression Prednisone 5 mg every other day and if not improving after above measures ok to increase Prednisone to max of 20 mg per day until better then taper     08/12/2017  f/u ov/Celia Friedland re: sarcoidsis on symb 160 2bid and prednionse 5mg  qod  Chief Complaint  Patient presents with  . Follow-up    cxr done today. Breathing is doing well and no new co's.   Dyspnea:  MMRC1 = can walk nl pace, flat grade, can't hurry or go uphills or steps s sob   rec Work on inhaler technique:   Try prednisone 5 mg one half every other day    08/17/2018  f/u ov/Aqeel Norgaard re: sarcoid on 5 mg one half qod  Chief Complaint  Patient presents with  . Follow-up    Breathing is doing well and no new co's.   Dyspnea:  Still = MMRC1 = can  walk nl pace, flat grade, can't hurry or go uphills or steps s sob   Cough: none Sleeping: able to lie flat / 1 pillows rec Try prednisone 2.5 mg every 3rd day for a month then stop - call if new symptoms (cough, short of breath, poor appetite)      09/28/2018  f/u ov/Bastien Strawser re: sarcoid with likely airway involvement  Dyspnea:  MMRC1 = can walk nl pace, flat grade, can't hurry or go uphills or steps s sob   Cough: minimal daytime non prod/sporadic  Sleeping: ok on side / bed if flat  rec Work on inhaler technique:   Call if nausea not better in about 4 weeks Make sure you check your oxygen saturations at highest level of activity   01/19/2019  f/u ov/Myrlene Riera re: sarcoid off all prednisone since sept 2020  Chief Complaint  Patient presents with  . Follow-up    Pt states shes been doing pretty good. SOB on exertion. Occassional non productive cough and wheezing. Denies any fever or chills.  Dyspnea:  Housework including a little sob but never below 93% RA Cough: minimal, nothing noce Sleeping: bed if flat, on side  SABA use: none  02: none    No obvious day to day or daytime variability or assoc excess/ purulent sputum or mucus plugs or hemoptysis or cp or chest tightness, subjective wheeze or overt sinus or hb symptoms.   Sleeping as above  without nocturnal  or early am exacerbation  of respiratory  c/o's or need for noct saba. Also denies any obvious fluctuation of symptoms with weather or environmental changes or other aggravating or alleviating factors except as outlined above   No unusual exposure hx or h/o childhood pna/ asthma or knowledge of premature birth.  Current Allergies, Complete Past Medical History, Past Surgical History, Family History, and Social History were reviewed in 03/19/2019 record.  ROS  The following are not active complaints unless bolded Hoarseness, sore throat, dysphagia, dental problems, itching, sneezing,  nasal congestion or  discharge of excess mucus or purulent secretions, ear ache,   fever, chills, sweats, unintended wt loss or wt gain, classically pleuritic or exertional cp,  orthopnea pnd or arm/hand swelling  or leg swelling, presyncope, palpitations, abdominal pain, anorexia, nausea, vomiting, diarrhea  or change in  bowel habits or change in bladder habits, change in stools or change in urine, dysuria, hematuria,  rash, arthralgias  mostly back / legs , visual complaints, headache, numbness, weakness or ataxia or problems with walking or coordination,  change in mood or  memory.        Current Meds  Medication Sig  . aspirin 81 MG tablet Take 81 mg by mouth daily.  . budesonide-formoterol (SYMBICORT) 160-4.5 MCG/ACT inhaler Inhale 2 puffs into the lungs 2 (two) times daily.  . cetirizine (ZYRTEC) 10 MG tablet Take 10 mg by mouth daily as needed.    . famotidine (PEPCID) 20 MG tablet Take 20 mg by mouth daily.  . Meloxicam 10 MG CAPS   . telmisartan-hydrochlorothiazide (MICARDIS HCT) 80-25 MG per tablet Take 1/2 tab by mouth once daily                                  Past Medical History:  HYPERTENSION (ICD-401.9)  WEIGHT GAIN  - Target wt = 179 for BMI < 30  GERD (ICD-530.81)  SARCOIDOSIS (ICD-135) Prednisone 2000 -2006 rash/cough...................................Marland KitchenWert/ Sallye Lat  - Pred restarted daily 04/2007 > every third day dosing March 01, 2009 > taper off by May 14 2009 > flared by middle of May 2011 > resume prednisone May 28 2009 for sob/cough  - dulera 100 for ? airway involvement > HFA 25% July 28, 2009 > 50% September 15, 2009, rx 200 dulera > pt using prn since 05/2010 s recurrent cough - HFA 75% 09/03/2011              Objective:   Physical Exam  amb pleasant wf nad   wt 188 July 27, 2008 > 177 July 28, 2009  > 178 08/10/2010  > 05/28/2011  179 > 09/03/2011  178 > 179 01/24/2012 > 06/22/2012  178 > 01/05/2013  176 > 07/13/2013  167> 12/16/13 166 > 06/21/2014   166 > 12/20/2014    167 > 08/11/2015   157 > 08/12/2016  158 >  08/12/2017  150>  08/17/2018  143 > 09/28/2018  143 > 01/19/2019 144     HEENT : pt wearing mask not removed for exam due to covid -19 concerns.    NECK :  without JVD/Nodes/TM/ nl carotid upstrokes bilaterally   LUNGS: no acc muscle use,  Nl contour chest with minimal insp squeaks  bilaterally without cough on insp or exp maneuvers   CV:  RRR  no s3 or murmur or increase in P2, and no edema   ABD:  soft and nontender with nl inspiratory excursion in the supine position. No bruits or organomegaly appreciated, bowel sounds nl  MS:  Nl gait/ ext warm without deformities, calf tenderness, cyanosis or clubbing No obvious joint restrictions   SKIN: warm and dry without lesions    NEURO:  alert, approp, nl sensorium with  no motor or cerebellar deficits apparent.         Assessment:

## 2019-01-19 NOTE — Patient Instructions (Signed)
Make sure you check your oxygen saturations at highest level of activity to be sure it stays over 90% and let me know if trending downward    Please schedule a follow up visit in 6 months but call sooner if needed

## 2019-01-20 ENCOUNTER — Encounter: Payer: Self-pay | Admitting: Internal Medicine

## 2019-01-20 NOTE — Assessment & Plan Note (Addendum)
Onset of symptoms around 1995 / dx 2000     - Pred restarted daily 04/2007 > every third day dosing March 01, 2009 > taper off by May 14 2009 > flared by middle of May 2011 > resume prednisone May 28 2009 for sob/cough  - dulera 100 for ? airway involvement > HFA 25% July 28, 2009 > 50% September 15, 2009, rx 200 dulera > pt stopped 05/2010 s flare of symptoms - Reduced dose to 2.5 mg qod 02/25/2011  > adrenal insuff symptoms> resolved on 5 mg qod - PFT's 01/24/2012  FEV 1.28 (59%) 56 ratio and no better p B2, DLCO 83% - PFT's  08/11/2015  FEV1 1.14 (49 % ) ratio 59  p 6 % improvement from saba p nothing prior to study with DLCO  64/62 % corrects to 98  % for alv volume   - 08/17/2018   Walked RA x one lap =  approx 250 ft slow pace > stopped due to desats to 87% (walking more than she usually does ) while on pred 2.5 mg qod > d/c completely 09/15/2018 > mild nausea resolved p after 11/15/2018   Doing great off pred x sev months x for some joint aches which are more c/w djd than typical sarcoid patterns.  Advised: Make sure you check your oxygen saturations at highest level of activity to be sure it stays over 90%    May need Hydrocortisone for stress eg acute illness/ surgery.   Pt informed of the seriousness of COVID 19 infection as a direct risk to lung health  and safey and to close contacts and should continue to wear a facemask in public and minimize exposure to public locations but especially avoid any area or activity where non-close contacts are not observing distancing or wearing an appropriate face mask.  I strongly recommended vaccine when offered.    >>> f/u in 6 m to min exp to virus  - call sooner if needed          Each maintenance medication was reviewed in detail including emphasizing most importantly the difference between maintenance and prns and under what circumstances the prns are to be triggered using an action plan format that is not reflected in the computer generated  alphabetically organized AVS which I have not found useful in most complex patients, especially with respiratory illnesses  Total time for H and P, chart review, counseling,  and generating AVS / charting =  20 min

## 2019-03-18 ENCOUNTER — Telehealth: Payer: Self-pay | Admitting: Internal Medicine

## 2019-03-18 ENCOUNTER — Other Ambulatory Visit: Payer: Self-pay | Admitting: Internal Medicine

## 2019-03-18 MED ORDER — BUDESONIDE-FORMOTEROL FUMARATE 160-4.5 MCG/ACT IN AERO: 2.0000 | INHALATION_SPRAY | Freq: Two times a day (BID) | RESPIRATORY_TRACT | 5 refills | Status: DC

## 2019-03-18 NOTE — Telephone Encounter (Signed)
Called Pt: breathing a bit worse x 24 h thought it might be due to mouthwash for thrush  Rec:  pred 20mg   x 3 days, 10 mg x 3 days and off  Use arm and hammer toothpaste p symbicort 160 and blow symb out thru nose to reduce risk of recurrent thrush from ICS  Of if not back to baseline in one week, call sooner if needed

## 2019-03-18 NOTE — Telephone Encounter (Signed)
Dr. Sherene Sires,  I called the patient back and she stated her PCP issued nystatin for oral thrush for 10 days. Last day to use the mouth rinse is tomorrow.   However, she noticed yesterday she started to get a little more short of breath and used the Symbicort which did help. She wanted to make you aware based on the directions you gave her at the last appointment on 01/19/19.  She said she still had some prednisone but did not have to take any of it. She will contact her PCP once she has finished the nystatin since the oral thrush has not completely resolved.  Are there any suggestions for this patient since the symbicort did work?

## 2019-03-23 ENCOUNTER — Other Ambulatory Visit: Payer: Self-pay | Admitting: Internal Medicine

## 2019-03-23 ENCOUNTER — Telehealth: Payer: Self-pay | Admitting: Internal Medicine

## 2019-03-23 MED ORDER — BUDESONIDE-FORMOTEROL FUMARATE 160-4.5 MCG/ACT IN AERO: 2.0000 | INHALATION_SPRAY | Freq: Two times a day (BID) | RESPIRATORY_TRACT | 5 refills | Status: DC

## 2019-03-23 NOTE — Telephone Encounter (Signed)
LMOMTCB x 1 

## 2019-03-23 NOTE — Telephone Encounter (Signed)
No problem with the covid vaccine and thrush  Needs ov if losing ground with sats doing the same intensity of exertion as previously tolerated

## 2019-03-23 NOTE — Telephone Encounter (Signed)
Pt returned called call. Please call back

## 2019-03-23 NOTE — Telephone Encounter (Signed)
Called and spoke to pt. Appt made with MW for 3/10 at 12p. Pt verbalized understanding and denied any further questions or concerns at this time.

## 2019-03-23 NOTE — Telephone Encounter (Signed)
Pt called back to add that baking soda has helped thrush-- still has just a little bit but is brushing after each use of inhaler and seeing improvement.

## 2019-03-23 NOTE — Telephone Encounter (Signed)
Called and spoke to pt. Pt states her thrush is better, she feels much improved since last week (see phone note from 3/4) but would like to schedule her COVID vaccine and is unsure if she should wait until her thrush is completely resolved.   Also, pt states she has been monitoring her spo2 and while she is doing house chores her spo2 has dropped to 87% on RA. Pt states she will rest and then within 30 sec she is above 90%.   Dr. Sherene Sires please advise on pt rescheduling her COVID vaccine and her spo2. Thanks.

## 2019-03-24 ENCOUNTER — Encounter: Payer: Self-pay | Admitting: Internal Medicine

## 2019-03-24 ENCOUNTER — Other Ambulatory Visit: Payer: Self-pay

## 2019-03-24 ENCOUNTER — Ambulatory Visit (INDEPENDENT_AMBULATORY_CARE_PROVIDER_SITE_OTHER): Payer: Medicare HMO

## 2019-03-24 ENCOUNTER — Ambulatory Visit: Payer: Medicare HMO | Admitting: Internal Medicine

## 2019-03-24 DIAGNOSIS — J939 Pneumothorax, unspecified: Secondary | ICD-10-CM | POA: Diagnosis not present

## 2019-03-24 DIAGNOSIS — R0600 Dyspnea, unspecified: Secondary | ICD-10-CM

## 2019-03-24 DIAGNOSIS — J45991 Cough variant asthma: Secondary | ICD-10-CM

## 2019-03-24 DIAGNOSIS — R0609 Other forms of dyspnea: Secondary | ICD-10-CM

## 2019-03-24 DIAGNOSIS — D869 Sarcoidosis, unspecified: Secondary | ICD-10-CM | POA: Diagnosis not present

## 2019-03-24 LAB — SEDIMENTATION RATE: Sed Rate: 2 mm/hr (ref 0–30)

## 2019-03-24 LAB — CBC WITH DIFFERENTIAL/PLATELET
Basophils Absolute: 0.1 10*3/uL (ref 0.0–0.1)
Basophils Relative: 0.6 % (ref 0.0–3.0)
Eosinophils Absolute: 0.8 10*3/uL — ABNORMAL HIGH (ref 0.0–0.7)
Eosinophils Relative: 7.2 % — ABNORMAL HIGH (ref 0.0–5.0)
HCT: 44.3 % (ref 36.0–46.0)
Hemoglobin: 15 g/dL (ref 12.0–15.0)
Lymphocytes Relative: 25.6 % (ref 12.0–46.0)
Lymphs Abs: 2.8 10*3/uL (ref 0.7–4.0)
MCHC: 33.8 g/dL (ref 30.0–36.0)
MCV: 92.1 fl (ref 78.0–100.0)
Monocytes Absolute: 0.9 10*3/uL (ref 0.1–1.0)
Monocytes Relative: 8.2 % (ref 3.0–12.0)
Neutro Abs: 6.3 10*3/uL (ref 1.4–7.7)
Neutrophils Relative %: 58.4 % (ref 43.0–77.0)
Platelets: 213 10*3/uL (ref 150.0–400.0)
RBC: 4.81 Mil/uL (ref 3.87–5.11)
RDW: 13.5 % (ref 11.5–15.5)
WBC: 10.9 10*3/uL — ABNORMAL HIGH (ref 4.0–10.5)

## 2019-03-24 LAB — BRAIN NATRIURETIC PEPTIDE: Pro B Natriuretic peptide (BNP): 51 pg/mL (ref 0.0–100.0)

## 2019-03-24 LAB — BASIC METABOLIC PANEL
BUN: 12 mg/dL (ref 6–23)
CO2: 31 mEq/L (ref 19–32)
Calcium: 10.7 mg/dL — ABNORMAL HIGH (ref 8.4–10.5)
Chloride: 99 mEq/L (ref 96–112)
Creatinine, Ser: 0.73 mg/dL (ref 0.40–1.20)
GFR: 78.46 mL/min (ref >60.00)
Glucose, Bld: 103 mg/dL — ABNORMAL HIGH (ref 70–99)
Potassium: 3.5 mEq/L (ref 3.5–5.1)
Sodium: 137 mEq/L (ref 135–145)

## 2019-03-24 LAB — D-DIMER, QUANTITATIVE: D-Dimer, Quant: 0.54 mcg/mL FEU — ABNORMAL HIGH (ref <0.50)

## 2019-03-24 MED ORDER — PREDNISONE 5 MG PO TABS: ORAL_TABLET | ORAL | 2 refills | Status: DC

## 2019-03-24 NOTE — Patient Instructions (Addendum)
Prednisone 5 mg x 2 daily x one week then 5 mg  One daily until return   Please remember to go to the lab and x-ray department   for your tests - we will call you with the results when they are available.  Make sure you check your oxygen saturations at highest level of activity to be sure it stays over 90%  And call if trending down again  Please schedule a follow up visit in 3 weeks  but call sooner if needed

## 2019-03-24 NOTE — Progress Notes (Signed)
Subjective:     Patient ID: Allison Osborn, female   DOB: 1945/06/14     MRN: [id number]    Brief patient profile:  65  yowf never smoker with a dx of sarcoid 2000  manifested clinically with both skin rash around ankles and cough and extreme fatigue and recurrent since diagnosis  although in retrospect she had previous parenchymal changes on cxr  suggestive of sarcoid dating back at least 5 years prior to dx - completely off prednisone as of 09/16/2018    History of Present Illness  01/24/2012 f/u ov/Allison Osborn cc all smiles, no limiting sob or cough. rec If cough or short ness of breath Dulera 200  Take 2 puffs first thing in am and then another 2 puffs about 12 hours later.  Prednisone 5 mg every other day is your floor.       12/16/2013 f/u ov/Allison Osborn re: sarcoid cough is main issue/ no better on dulera 200  and worse if on less than pred 10 one half qod  Chief Complaint  Patient presents with  . Follow-up    Pt states overall doing well. She c/o PND and is taking zyrtec and this helps.   More active overall since last ov ,  Not limited by breathing from desired activities   Cough with activity outside, not noct, not productive rec For cough try the dulera 100 up to 2 every 12 hours to see if helps and if not ok to continue the 200  Work on inhaler technique:    For drainage take chlortrimeton (chlorpheniramine) 4 mg every 4 hours available over the counter (may cause drowsiness)      06/21/2014 f/u ov/Allison Osborn re: sarcoid/ pred 5 mg qod Chief Complaint  Patient presents with  . Follow-up    Pt states doing well and denies any co's today.   didn't really think the dulera helped cough which resolved ? On its own but also on ppi bid and prn antihistamines  rec Keep up with eye appts> did not do as of 12/20/2014 > reinforced     12/20/2014  f/u ov/Allison Osborn re: sarcoid / chronic cough on ppi bid resolved  Chief Complaint  Patient presents with  . Follow-up    Doing well and denies any co's.  She has not had to use Dulera.    No obvious daytime variabilty or assoc sob or  cp or chest tightness, subjective wheeze overt sinus or hb symptoms. No unusual exp hx  rec Try taking Try prilosec otc 20mg   Take 30-60 min before first meal of the day and Pepcid ac (famotidine) 20 mg one @  bedtime until cough is completely gone for at least a week without the need for cough suppression      08/12/2016  f/u ov/Allison Osborn re:  Sarcoidosis on prn dulera but has not used x one year/ pred 5 mg qod / pepcid at breakfast  Chief Complaint  Patient presents with  . Follow-up    Breathing is doing well. She has occ wheezing. She has not had to use Dulera.      doe = MMRC1 = can walk nl pace, flat grade, can't hurry or go uphills or steps s sob   Sleeps fine  Only took one course of pred of 20 mg over the last year and did not initiate dulera as instructed  rec If flare cough/ wheeze/ short of breath > dulera 200 2 puffs every 12 hours as needed Ok to leave off the  pepcid but for persistent cough > restart  Try prilosec otc 20mg   Take 30-60 min before first meal of the day and Pepcid ac (famotidine) 20 mg one @  bedtime until cough is completely gone for at least a week without the need for cough suppression Prednisone 5 mg every other day and if not improving after above measures ok to increase Prednisone to max of 20 mg per day until better then taper     08/12/2017  f/u ov/Allison Osborn re: sarcoidsis on symb 160 2bid and prednionse 5mg  qod  Chief Complaint  Patient presents with  . Follow-up    cxr done today. Breathing is doing well and no new co's.   Dyspnea:  MMRC1 = can walk nl pace, flat grade, can't hurry or go uphills or steps s sob   rec Work on inhaler technique:   Try prednisone 5 mg one half every other day    08/17/2018  f/u ov/Allison Osborn re: sarcoid on 5 mg one half qod  Chief Complaint  Patient presents with  . Follow-up    Breathing is doing well and no new co's.   Dyspnea:  Still = MMRC1 = can  walk nl pace, flat grade, can't hurry or go uphills or steps s sob   Cough: none Sleeping: able to lie flat / 1 pillows rec Try prednisone 2.5 mg every 3rd day for a month then stop - call if new symptoms (cough, short of breath, poor appetite)      09/28/2018  f/u ov/Allison Osborn re: sarcoid with likely airway involvement  Dyspnea:  MMRC1 = can walk nl pace, flat grade, can't hurry or go uphills or steps s sob   Cough: minimal daytime non prod/sporadic  Sleeping: ok on side / bed if flat  rec Work on inhaler technique:   Call if nausea not better in about 4 weeks Make sure you check your oxygen saturations at highest level of activity   01/19/2019  f/u ov/Allison Osborn re: sarcoid off all prednisone since sept 2020  Chief Complaint  Patient presents with  . Follow-up    Pt states shes been doing pretty good. SOB on exertion. Occassional non productive cough and wheezing. Denies any fever or chills.  Dyspnea:  Housework including a little sob but never below 93% RA Cough: minimal, nothing noct Sleeping: bed if flat, on side  SABA use: none  02: none  rec Make sure you check your oxygen saturations at highest level of activity to be sure it stays over 90% and let me know if trending downward      03/24/2019  Acute ov/Allison Osborn re: symb 160 Take 2 puffs first thing in am and then another 2 puffs about 12 hours later.  03/18/19 Called in  pred 20 x 3 days  20 x 3 days last dose 03/23/19 better to point of sats 90% RA with activity by day of ov Chief Complaint  Patient presents with  . Follow-up    She started to notice decreased o2 sats with exertion over the past several days- not having any increased SOB. She has had some back pain.   Dyspnea:  Some worse x one week assoc with minimal cough that has improved Sleeping: couldn't breath as well so sleeping 45 degrees in recliner since onset of sob  SABA use: none  02: none  Mid/lower back pain is bilateral sym and not pleuritic at all  But not really  positional either. Thrush resolved p using arm and hammer toothpaste  p symbicort     No obvious day to day or daytime variability or assoc excess/ purulent sputum or mucus plugs or hemoptysis or cp or chest tightness, subjective wheeze or overt sinus or hb symptoms.     Also denies any obvious fluctuation of symptoms with weather or environmental changes or other aggravating or alleviating factors except as outlined above   No unusual exposure hx or h/o childhood pna/ asthma or knowledge of premature birth.  Current Allergies, Complete Past Medical History, Past Surgical History, Family History, and Social History were reviewed in [employer] record.  ROS  The following are not active complaints unless bolded Hoarseness, sore throat, dysphagia, dental problems, itching, sneezing,  nasal congestion or discharge of excess mucus or purulent secretions, ear ache,   fever, chills, sweats, unintended wt loss or wt gain, classically pleuritic or exertional cp,  orthopnea pnd or arm/hand swelling  or leg swelling, presyncope, palpitations, abdominal pain, anorexia, nausea, vomiting, diarrhea  or change in bowel habits or change in bladder habits, change in stools or change in urine, dysuria, hematuria,  rash, arthralgias, visual complaints, headache, numbness, weakness or ataxia or problems with walking or coordination,  change in mood or  Memory.        Current Meds  Medication Sig  . aspirin 81 MG tablet Take 81 mg by mouth daily.  . budesonide-formoterol (SYMBICORT) 160-4.5 MCG/ACT inhaler Inhale 2 puffs into the lungs 2 (two) times daily.  . cetirizine (ZYRTEC) 10 MG tablet Take 10 mg by mouth daily as needed.    . famotidine (PEPCID) 20 MG tablet Take 20 mg by mouth daily.  . Meloxicam 10 MG CAPS   . telmisartan-hydrochlorothiazide (MICARDIS HCT) 80-25 MG per tablet Take 1/2 tab by mouth once daily                                     Past Medical History:   HYPERTENSION (ICD-401.9)  WEIGHT GAIN  - Target wt = 179 for BMI < 30  GERD (ICD-530.81)  SARCOIDOSIS (ICD-135) Prednisone 2000 -2006 rash/cough...................................Marland KitchenWert/ [facility]  - Pred restarted daily 04/2007 > every third day dosing March 01, 2009 > taper off by May 14 2009 > flared by middle of May 2011 > resume prednisone May 28 2009 for sob/cough  - dulera 100 for ? airway involvement > HFA 25% July 28, 2009 > 50% September 15, 2009, rx 200 dulera > pt using prn since 05/2010 s recurrent cough - HFA 75% 09/03/2011              Objective:   Physical Exam  amb pleasant wf nad  03/24/2019   136 01/19/2019     144  09/28/2018  143  wt 188 July 27, 2008 > 177 July 28, 2009  > 178 08/10/2010  > 05/28/2011  179 > 09/03/2011  178 > 179 01/24/2012 > 06/22/2012  178 > 01/05/2013  176 > 07/13/2013  167> 12/16/13 166 > 06/21/2014   166 > 12/20/2014   167 > 08/11/2015   157 > 08/12/2016  158 >  08/12/2017  150>  08/17/2018  143      Vital signs reviewed  03/24/2019  - Note at rest 02 sats  94% on RA       HEENT : pt wearing mask not removed for exam due to covid -19 concerns.    NECK :  without JVD/Nodes/TM/  nl carotid upstrokes bilaterally   LUNGS: no acc muscle use,  Nl contour chest with minimal insp squeaks/pops  bilaterally without cough on insp or exp maneuvers   CV:  RRR  no s3 or murmur or increase in P2, and no edema   ABD:  soft and nontender with nl inspiratory excursion in the supine position. No bruits or organomegaly appreciated, bowel sounds nl  MS:  Nl gait/ ext warm without deformities, calf tenderness, cyanosis or clubbing No obvious joint restrictions   SKIN: warm and dry without lesions    NEURO:  alert, approp, nl sensorium with  no motor or cerebellar deficits apparent.     CXR PA and Lateral:   03/24/2019 :    I personally reviewed images and agree with radiology impression as follows:   Interval development of mild right apical pneumothorax on the  order of 10-15%. Stable bilateral reticular lung densities are noted most consistent with scarring, fibrosis or chronic interstitial lung Disease.  Labs ordered/ reviewed:      Chemistry      Component Value Date/Time   NA 137 03/24/2019 1142   K 3.5 03/24/2019 1142   CL 99 03/24/2019 1142   CO2 31 03/24/2019 1142   BUN 12 03/24/2019 1142   CREATININE 0.73 03/24/2019 1142      Component Value Date/Time   CALCIUM 10.7 (H) 03/24/2019 1142        Lab Results  Component Value Date   WBC 10.9 (H) 03/24/2019   HGB 15.0 03/24/2019   HCT 44.3 03/24/2019   MCV 92.1 03/24/2019   PLT 213.0 03/24/2019       EOS                                                              0.8                                     03/24/2019    Lab Results  Component Value Date   DDIMER 0.54 (H) 03/24/2019       Lab Results  Component Value Date   PROBNP 51.0 03/24/2019       Lab Results  Component Value Date   ESRSEDRATE 2 03/24/2019              Assessment:

## 2019-03-25 ENCOUNTER — Encounter: Payer: Self-pay | Admitting: Internal Medicine

## 2019-03-25 ENCOUNTER — Telehealth: Payer: Self-pay | Admitting: Internal Medicine

## 2019-03-25 ENCOUNTER — Other Ambulatory Visit: Payer: Self-pay | Admitting: Internal Medicine

## 2019-03-25 DIAGNOSIS — J939 Pneumothorax, unspecified: Secondary | ICD-10-CM | POA: Insufficient documentation

## 2019-03-25 NOTE — Assessment & Plan Note (Signed)
No evidence PE, chf, anemia or metabolic cause for sob - see ptx.

## 2019-03-25 NOTE — Progress Notes (Signed)
Spoke with pt and notified of results per Dr. Wert. Pt verbalized understanding and denied any questions. 

## 2019-03-25 NOTE — Telephone Encounter (Signed)
I had spoken with with pt this morning about her results and have scheduled her with Dr Sherene Sires for ov and repeat cxr for 03/26/19 at 10:00 am.

## 2019-03-25 NOTE — Assessment & Plan Note (Addendum)
New dx 03/24/2019   The abrupt onset of symptoms one week ago concerned me for pna or PE or chf but most likely is explained by the ptx which may be in healing stages at this point as she is feeling better today than over the last week but will need to be monitored closed with low threshold to admit and place catheter, probably just small bore in this case.   >>> f/u with cxr am 3/12 and to er sooner if needed         Each maintenance medication was reviewed in detail including emphasizing most importantly the difference between maintenance and prns and under what circumstances the prns are to be triggered using an action plan format where appropriate.  Total time for H and P, chart review, counseling, teaching device and generating customized AVS unique to this acute office visit with High MDM / charting = 40 min

## 2019-03-25 NOTE — Assessment & Plan Note (Signed)
08/12/2017    continue symbicort 160 2bid  - Eos 03/24/2019  = 0.8 p finishing prednisone short course 03/23/19  - 03/24/2019  After extensive coaching inhaler device,  effectiveness =    90%   >>>  continue symbicort 160 2bid and daily prednisone for sarcoid, consider allergy eval next

## 2019-03-25 NOTE — Assessment & Plan Note (Addendum)
Onset of symptoms around 1995 / dx 2000     - Pred restarted daily 04/2007 > every third day dosing March 01, 2009 > taper off by May 14 2009 > flared by middle of May 2011 > resume prednisone May 28 2009 for sob/cough  - dulera 100 for ? airway involvement > HFA 25% July 28, 2009 > 50% September 15, 2009, rx 200 dulera > pt stopped 05/2010 s flare of symptoms - Reduced dose to 2.5 mg qod 02/25/2011  > adrenal insuff symptoms> resolved on 5 mg qod - PFT's 01/24/2012  FEV 1.28 (59%) 56 ratio and no better p B2, DLCO 83% - PFT's  08/11/2015  FEV1 1.14 (49 % ) ratio 59  p 6 % improvement from saba p nothing prior to study with DLCO  64/62 % corrects to 98  % for alv volume   - 08/17/2018   Walked RA x one lap =  approx 250 ft slow pace > stopped due to desats to 87% (walking more than she usually does ) while on pred 2.5 mg qod > d/c completely 09/15/2018 > mild nausea resolved p after 11/15/2018  - resumed daily prednisone 03/18/19 for desats with ex and caclcium 10.7   The goal with a chronic steroid dependent illness is always arriving at the lowest effective dose that controls the disease/symptoms and not accepting a set "formula" which is based on statistics or guidelines that don't always take into account patient  variability or the natural hx of the dz in every individual patient, which may well vary over time.  For now therefore I recommend the patient maintain  10 mg ceiling and 5mg  floor until sort out the new dx of R PTX and note the elevated eos as well - see sep a/p

## 2019-03-26 ENCOUNTER — Other Ambulatory Visit: Payer: Self-pay

## 2019-03-26 ENCOUNTER — Ambulatory Visit (INDEPENDENT_AMBULATORY_CARE_PROVIDER_SITE_OTHER): Payer: Medicare HMO

## 2019-03-26 ENCOUNTER — Telehealth: Payer: Self-pay | Admitting: Internal Medicine

## 2019-03-26 ENCOUNTER — Ambulatory Visit: Payer: Medicare HMO | Admitting: Internal Medicine

## 2019-03-26 ENCOUNTER — Encounter: Payer: Self-pay | Admitting: Internal Medicine

## 2019-03-26 VITALS — BP 132/74 | HR 112 | Temp 98.0°F | Ht <= 58 in | Wt 136.0 lb

## 2019-03-26 DIAGNOSIS — D869 Sarcoidosis, unspecified: Secondary | ICD-10-CM | POA: Diagnosis not present

## 2019-03-26 DIAGNOSIS — J939 Pneumothorax, unspecified: Secondary | ICD-10-CM | POA: Diagnosis not present

## 2019-03-26 DIAGNOSIS — J9611 Chronic respiratory failure with hypoxia: Secondary | ICD-10-CM | POA: Diagnosis not present

## 2019-03-26 NOTE — Assessment & Plan Note (Signed)
Onset of symptoms around 1995 / dx 2000     - Pred restarted daily 04/2007 > every third day dosing March 01, 2009 > taper off by May 14 2009 > flared by middle of May 2011 > resume prednisone May 28 2009 for sob/cough  - dulera 100 for ? airway involvement > HFA 25% July 28, 2009 > 50% September 15, 2009, rx 200 dulera > pt stopped 05/2010 s flare of symptoms - Reduced dose to 2.5 mg qod 02/25/2011  > adrenal insuff symptoms> resolved on 5 mg qod - PFT's 01/24/2012  FEV 1.28 (59%) 56 ratio and no better p B2, DLCO 83% - PFT's  08/11/2015  FEV1 1.14 (49 % ) ratio 59  p 6 % improvement from saba p nothing prior to study with DLCO  64/62 % corrects to 98  % for alv volume   - 08/17/2018   Walked RA x one lap =  approx 250 ft slow pace > stopped due to desats to 87% (walking more than she usually does ) while on pred 2.5 mg qod > d/c completely 09/15/2018 > mild nausea resolved p after 11/15/2018  - resumed daily prednisone 03/18/19 for desats with ex and caclcium 10.7   Unclear whether the sarcoid is responsible for the PTX but she has both parenchymal and likely airway involvement either of which could contribute to an air leak though the airway is more likely so no change in rx as outline:  symbicort 160 2bid / pred 10 mg ceiling and 5 mg floor for now.

## 2019-03-26 NOTE — Progress Notes (Signed)
Subjective:     Patient ID: Allison Osborn, female   DOB: August 14, 1945     MRN: [id number]    Brief patient profile:  47  yowf never smoker with a dx of sarcoid 2000  manifested clinically with both skin rash around ankles and cough and extreme fatigue and recurrent since diagnosis  although in retrospect she had previous parenchymal changes on cxr  suggestive of sarcoid dating back at least 5 years prior to dx - completely off prednisone as of 09/16/2018    History of Present Illness  01/24/2012 f/u ov/Allison Osborn cc all smiles, no limiting sob or cough. rec If cough or short ness of breath Dulera 200  Take 2 puffs first thing in am and then another 2 puffs about 12 hours later.  Prednisone 5 mg every other day is your floor.       12/16/2013 f/u ov/Allison Osborn re: sarcoid cough is main issue/ no better on dulera 200  and worse if on less than pred 10 one half qod  Chief Complaint  Patient presents with  . Follow-up    Pt states overall doing well. She c/o PND and is taking zyrtec and this helps.   More active overall since last ov ,  Not limited by breathing from desired activities   Cough with activity outside, not noct, not productive rec For cough try the dulera 100 up to 2 every 12 hours to see if helps and if not ok to continue the 200  Work on inhaler technique:    For drainage take chlortrimeton (chlorpheniramine) 4 mg every 4 hours available over the counter (may cause drowsiness)      06/21/2014 f/u ov/Allison Osborn re: sarcoid/ pred 5 mg qod Chief Complaint  Patient presents with  . Follow-up    Pt states doing well and denies any co's today.   didn't really think the dulera helped cough which resolved ? On its own but also on ppi bid and prn antihistamines  rec Keep up with eye appts> did not do as of 12/20/2014 > reinforced     12/20/2014  f/u ov/Allison Osborn re: sarcoid / chronic cough on ppi bid resolved  Chief Complaint  Patient presents with  . Follow-up    Doing well and denies any co's.  She has not had to use Dulera.    No obvious daytime variabilty or assoc sob or  cp or chest tightness, subjective wheeze overt sinus or hb symptoms. No unusual exp hx  rec Try taking Try prilosec otc 20mg   Take 30-60 min before first meal of the day and Pepcid ac (famotidine) 20 mg one @  bedtime until cough is completely gone for at least a week without the need for cough suppression      08/12/2016  f/u ov/Allison Osborn re:  Sarcoidosis on prn dulera but has not used x one year/ pred 5 mg qod / pepcid at breakfast  Chief Complaint  Patient presents with  . Follow-up    Breathing is doing well. She has occ wheezing. She has not had to use Dulera.      doe = MMRC1 = can walk nl pace, flat grade, can't hurry or go uphills or steps s sob   Sleeps fine  Only took one course of pred of 20 mg over the last year and did not initiate dulera as instructed  rec If flare cough/ wheeze/ short of breath > dulera 200 2 puffs every 12 hours as needed Ok to leave off the  pepcid but for persistent cough > restart  Try prilosec otc 20mg   Take 30-60 min before first meal of the day and Pepcid ac (famotidine) 20 mg one @  bedtime until cough is completely gone for at least a week without the need for cough suppression Prednisone 5 mg every other day and if not improving after above measures ok to increase Prednisone to max of 20 mg per day until better then taper     08/12/2017  f/u ov/Allison Osborn re: sarcoidsis on symb 160 2bid and prednionse 5mg  qod  Chief Complaint  Patient presents with  . Follow-up    cxr done today. Breathing is doing well and no new co's.   Dyspnea:  MMRC1 = can walk nl pace, flat grade, can't hurry or go uphills or steps s sob   rec Work on inhaler technique:   Try prednisone 5 mg one half every other day    08/17/2018  f/u ov/Allison Osborn re: sarcoid on 5 mg one half qod  Chief Complaint  Patient presents with  . Follow-up    Breathing is doing well and no new co's.   Dyspnea:  Still = MMRC1 = can  walk nl pace, flat grade, can't hurry or go uphills or steps s sob   Cough: none Sleeping: able to lie flat / 1 pillows rec Try prednisone 2.5 mg every 3rd day for a month then stop - call if new symptoms (cough, short of breath, poor appetite)      09/28/2018  f/u ov/Allison Osborn re: sarcoid with likely airway involvement  Dyspnea:  MMRC1 = can walk nl pace, flat grade, can't hurry or go uphills or steps s sob   Cough: minimal daytime non prod/sporadic  Sleeping: ok on side / bed if flat  rec Work on inhaler technique:   Call if nausea not better in about 4 weeks Make sure you check your oxygen saturations at highest level of activity   01/19/2019  f/u ov/Allison Osborn re: sarcoid off all prednisone since sept 2020  Chief Complaint  Patient presents with  . Follow-up    Pt states shes been doing pretty good. SOB on exertion. Occassional non productive cough and wheezing. Denies any fever or chills.  Dyspnea:  Housework including a little sob but never below 93% RA Cough: minimal, nothing noct Sleeping: bed if flat, on side  SABA use: none  02: none  rec Make sure you check your oxygen saturations at highest level of activity to be sure it stays over 90% and let me know if trending downward      03/24/2019  Acute ov/Allison Osborn re: symb 160 Take 2 puffs first thing in am and then another 2 puffs about 12 hours later.  03/18/19 Called in  pred 20 x 3 days  20 x 3 days last dose 03/23/19 better to point of sats 90% RA with activity by day of ov Chief Complaint  Patient presents with  . Follow-up    She started to notice decreased o2 sats with exertion over the past several days- not having any increased SOB. She has had some back pain.   Dyspnea:  Some worse x one week assoc with minimal cough that has improved Sleeping: couldn't breath as well so sleeping 45 degrees in recliner since onset of sob  SABA use: none  02: none  Mid/lower back pain is bilateral sym and not pleuritic at all  But not really  positional either. Thrush resolved p using arm and hammer toothpaste  p symbicort  rec Prednisone 5 mg x 2 daily x one week then 5 mg  One daily until return  Make sure you check your oxygen saturations at highest level of activity to be sure it stays over 90%  And call if trending down again Add cxr 10-15 % R PTX > f/u 48 h    03/26/2019  f/u ov/Neev Mcmains re: sarcoid with small R PTX  Chief Complaint  Patient presents with  . Follow-up    cxr repeated today.   Dyspnea:  amb in house better on pred Cough: some in am/ dry  Sleeping: back in bed one pillow  SABA use: no extra  02: none    No obvious day to day or daytime variability or assoc excess/ purulent sputum or mucus plugs or hemoptysis or cp or chest tightness, subjective wheeze or overt sinus or hb symptoms.   Sleeping better without nocturnal  or early am exacerbation  of respiratory  c/o's or need for noct saba. Also denies any obvious fluctuation of symptoms with weather or environmental changes or other aggravating or alleviating factors except as outlined above   No unusual exposure hx or h/o childhood pna/ asthma or knowledge of premature birth.  Current Allergies, Complete Past Medical History, Past Surgical History, Family History, and Social History were reviewed in [employer] record.  ROS  The following are not active complaints unless bolded Hoarseness, sore throat, dysphagia, dental problems, itching, sneezing,  nasal congestion or discharge of excess mucus or purulent secretions, ear ache,   fever, chills, sweats, unintended wt loss or wt gain, classically pleuritic or exertional cp,  orthopnea pnd or arm/hand swelling  or leg swelling, presyncope, palpitations, abdominal pain, anorexia, nausea, vomiting, diarrhea  or change in bowel habits or change in bladder habits, change in stools or change in urine, dysuria, hematuria,  rash, arthralgias, visual complaints, headache, numbness, weakness or  ataxia or problems with walking or coordination,  change in mood or  memory.        Current Meds  Medication Sig  . aspirin 81 MG tablet Take 81 mg by mouth daily.  . budesonide-formoterol (SYMBICORT) 160-4.5 MCG/ACT inhaler Inhale 2 puffs into the lungs 2 (two) times daily.  . cetirizine (ZYRTEC) 10 MG tablet Take 10 mg by mouth daily as needed.    . famotidine (PEPCID) 20 MG tablet Take 20 mg by mouth daily.  . Meloxicam 10 MG CAPS   . predniSONE (DELTASONE) 5 MG tablet 2 daily x one week then one daily  . telmisartan-hydrochlorothiazide (MICARDIS HCT) 80-25 MG per tablet Take 1/2 tab by mouth once daily                                    Past Medical History:  HYPERTENSION (ICD-401.9)  WEIGHT GAIN  - Target wt = 179 for BMI < 30  GERD (ICD-530.81)  SARCOIDOSIS (ICD-135) Prednisone 2000 -2006 rash/cough...................................Marland KitchenWert/ Sallye Lat  - Pred restarted daily 04/2007 > every third day dosing March 01, 2009 > taper off by May 14 2009 > flared by middle of May 2011 > resume prednisone May 28 2009 for sob/cough  - dulera 100 for ? airway involvement > HFA 25% July 28, 2009 > 50% September 15, 2009, rx 200 dulera > pt using prn since 05/2010 s recurrent cough - HFA 75% 09/03/2011  Objective:   Physical Exam  amb pleasant wf nad   03/26/2019  136  03/24/2019   136 01/19/2019     144  09/28/2018  143  wt 188 July 27, 2008 > 177 July 28, 2009  > 178 08/10/2010  > 05/28/2011  179 > 09/03/2011  178 > 179 01/24/2012 > 06/22/2012  178 > 01/05/2013  176 > 07/13/2013  167> 12/16/13 166 > 06/21/2014   166 > 12/20/2014   167 > 08/11/2015   157 > 08/12/2016  158 >  08/12/2017  150>  08/17/2018  143       Vital signs reviewed  03/26/2019  - Note at rest 02 sats  100% on RA      HEENT : pt wearing mask not removed for exam due to covid -19 concerns.    NECK :  without JVD/Nodes/TM/ nl carotid upstrokes bilaterally   LUNGS: no acc muscle use,  Nl contour  chest with minimal insp crackles  bilaterally without cough on insp or exp maneuvers and minimal decreased bs on R s rub.   CV:  RRR  no s3 or murmur or increase in P2, and no edema   ABD:  soft and nontender with nl inspiratory excursion in the supine position. No bruits or organomegaly appreciated, bowel sounds nl  MS:  Nl gait/ ext warm without deformities, calf tenderness, cyanosis or clubbing No obvious joint restrictions   SKIN: warm and dry without lesions    NEURO:  alert, approp, nl sensorium with  no motor or cerebellar deficits apparent.    CXR PA and Lateral:   03/24/2019 :    I personally reviewed images and agree with radiology impression as follows:   Interval development of mild right apical pneumothorax on the order of 10-15%. Stable bilateral reticular lung densities are noted most consistent with scarring, fibrosis or chronic interstitial lung Disease.     CXR PA and Lateral:   03/26/2019 :    I personally reviewed images and agree with radiology impression as follows:    Stable to slightly decreased right apical pneumothorax. Otherwise stable.               Assessment:

## 2019-03-26 NOTE — Assessment & Plan Note (Addendum)
New dx 03/24/2019  > slt improved 03/26/2019 and placed on 24h 02 @ 2lpm   This will help the residual  air in R chest be absorbed more rapidly and avoid admit/ chest tube placement hopefully  Also rec cough suppression with delsym and avoid house work/ unneeded activity over coming weekend.  >>> Advised risk of recurrence and need to go to her closest ER to report "air leak" on R if breathing worsens at any point/ otherwise f/u for cxr in one week   Discussed in detail all the  indications, usual  risks and alternatives  relative to the benefits with patient who agrees to proceed with conservative rx and f/u as outlined with contingency planning as well.     Each maintenance medication was reviewed in detail including emphasizing most importantly the difference between maintenance and prns and under what circumstances the prns are to be triggered using an action plan format where appropriate.  Total time for H and P, chart review, counseling, teaching device and generating customized AVS unique to this office visit / charting = 30 min

## 2019-03-26 NOTE — Patient Instructions (Signed)
For cough > delsym 2 tsp every 12 hours as needed with goal of no cough at all  Wear 02 as much as you can 2lpm 24/7  Please schedule a follow up office visit in 1 week , call sooner if needed

## 2019-03-26 NOTE — Assessment & Plan Note (Signed)
03/26/2019  Patient Saturations on Room Air at Rest = 94% Patient Saturations on Room Air while Ambulating = 88% Patient Saturations on 2 Liters of oxygen while Ambulating = 95  As of 03/26/2019  > 2lpm 24/7

## 2019-03-26 NOTE — Telephone Encounter (Signed)
Spoke with the pt  She is asking for the name of the o2 company the we sent her o2 order to  We sent her order to [facility]- I provided her with their phone number and she states nothing further needed

## 2019-04-02 ENCOUNTER — Other Ambulatory Visit: Payer: Self-pay

## 2019-04-02 ENCOUNTER — Encounter: Payer: Self-pay | Admitting: Internal Medicine

## 2019-04-02 ENCOUNTER — Ambulatory Visit: Payer: Medicare HMO | Admitting: Internal Medicine

## 2019-04-02 ENCOUNTER — Ambulatory Visit (INDEPENDENT_AMBULATORY_CARE_PROVIDER_SITE_OTHER): Payer: Medicare HMO

## 2019-04-02 DIAGNOSIS — D869 Sarcoidosis, unspecified: Secondary | ICD-10-CM | POA: Diagnosis not present

## 2019-04-02 DIAGNOSIS — J939 Pneumothorax, unspecified: Secondary | ICD-10-CM

## 2019-04-02 DIAGNOSIS — J9611 Chronic respiratory failure with hypoxia: Secondary | ICD-10-CM

## 2019-04-02 NOTE — Patient Instructions (Addendum)
Please remember to go to the  x-ray department  for your tests - I will review this before you leave the building  Please schedule a follow up office visit in 4 weeks, sooner if needed with cxr on return

## 2019-04-02 NOTE — Progress Notes (Signed)
Subjective:     Patient ID: Allison Osborn, female   DOB: August 14, 1945     MRN: [id number]    Brief patient profile:  47  yowf never smoker with a dx of sarcoid 2000  manifested clinically with both skin rash around ankles and cough and extreme fatigue and recurrent since diagnosis  although in retrospect she had previous parenchymal changes on cxr  suggestive of sarcoid dating back at least 5 years prior to dx - completely off prednisone as of 09/16/2018    History of Present Illness  01/24/2012 f/u ov/Karima Carrell cc all smiles, no limiting sob or cough. rec If cough or short ness of breath Dulera 200  Take 2 puffs first thing in am and then another 2 puffs about 12 hours later.  Prednisone 5 mg every other day is your floor.       12/16/2013 f/u ov/Azrael Maddix re: sarcoid cough is main issue/ no better on dulera 200  and worse if on less than pred 10 one half qod  Chief Complaint  Patient presents with  . Follow-up    Pt states overall doing well. She c/o PND and is taking zyrtec and this helps.   More active overall since last ov ,  Not limited by breathing from desired activities   Cough with activity outside, not noct, not productive rec For cough try the dulera 100 up to 2 every 12 hours to see if helps and if not ok to continue the 200  Work on inhaler technique:    For drainage take chlortrimeton (chlorpheniramine) 4 mg every 4 hours available over the counter (may cause drowsiness)      06/21/2014 f/u ov/Georgio Hattabaugh re: sarcoid/ pred 5 mg qod Chief Complaint  Patient presents with  . Follow-up    Pt states doing well and denies any co's today.   didn't really think the dulera helped cough which resolved ? On its own but also on ppi bid and prn antihistamines  rec Keep up with eye appts> did not do as of 12/20/2014 > reinforced     12/20/2014  f/u ov/Elyna Pangilinan re: sarcoid / chronic cough on ppi bid resolved  Chief Complaint  Patient presents with  . Follow-up    Doing well and denies any co's.  She has not had to use Dulera.    No obvious daytime variabilty or assoc sob or  cp or chest tightness, subjective wheeze overt sinus or hb symptoms. No unusual exp hx  rec Try taking Try prilosec otc 20mg   Take 30-60 min before first meal of the day and Pepcid ac (famotidine) 20 mg one @  bedtime until cough is completely gone for at least a week without the need for cough suppression      08/12/2016  f/u ov/Mallori Araque re:  Sarcoidosis on prn dulera but has not used x one year/ pred 5 mg qod / pepcid at breakfast  Chief Complaint  Patient presents with  . Follow-up    Breathing is doing well. She has occ wheezing. She has not had to use Dulera.      doe = MMRC1 = can walk nl pace, flat grade, can't hurry or go uphills or steps s sob   Sleeps fine  Only took one course of pred of 20 mg over the last year and did not initiate dulera as instructed  rec If flare cough/ wheeze/ short of breath > dulera 200 2 puffs every 12 hours as needed Ok to leave off the  pepcid but for persistent cough > restart  Try prilosec otc 20mg   Take 30-60 min before first meal of the day and Pepcid ac (famotidine) 20 mg one @  bedtime until cough is completely gone for at least a week without the need for cough suppression Prednisone 5 mg every other day and if not improving after above measures ok to increase Prednisone to max of 20 mg per day until better then taper     08/12/2017  f/u ov/Judy Goodenow re: sarcoidsis on symb 160 2bid and prednionse 5mg  qod  Chief Complaint  Patient presents with  . Follow-up    cxr done today. Breathing is doing well and no new co's.   Dyspnea:  MMRC1 = can walk nl pace, flat grade, can't hurry or go uphills or steps s sob   rec Work on inhaler technique:   Try prednisone 5 mg one half every other day    08/17/2018  f/u ov/Arlyce Circle re: sarcoid on 5 mg one half qod  Chief Complaint  Patient presents with  . Follow-up    Breathing is doing well and no new co's.   Dyspnea:  Still = MMRC1 = can  walk nl pace, flat grade, can't hurry or go uphills or steps s sob   Cough: none Sleeping: able to lie flat / 1 pillows rec Try prednisone 2.5 mg every 3rd day for a month then stop - call if new symptoms (cough, short of breath, poor appetite)      09/28/2018  f/u ov/Satvik Parco re: sarcoid with likely airway involvement  Dyspnea:  MMRC1 = can walk nl pace, flat grade, can't hurry or go uphills or steps s sob   Cough: minimal daytime non prod/sporadic  Sleeping: ok on side / bed if flat  rec Work on inhaler technique:   Call if nausea not better in about 4 weeks Make sure you check your oxygen saturations at highest level of activity   01/19/2019  f/u ov/Samaa Ueda re: sarcoid off all prednisone since sept 2020  Chief Complaint  Patient presents with  . Follow-up    Pt states shes been doing pretty good. SOB on exertion. Occassional non productive cough and wheezing. Denies any fever or chills.  Dyspnea:  Housework including a little sob but never below 93% RA Cough: minimal, nothing noct Sleeping: bed if flat, on side  SABA use: none  02: none  rec Make sure you check your oxygen saturations at highest level of activity to be sure it stays over 90% and let me know if trending downward      03/24/2019  Acute ov/Dwan Hemmelgarn re: symb 160 Take 2 puffs first thing in am and then another 2 puffs about 12 hours later.  03/18/19 Called in  pred 20 x 3 days  20 x 3 days last dose 03/23/19 better to point of sats 90% RA with activity by day of ov Chief Complaint  Patient presents with  . Follow-up    She started to notice decreased o2 sats with exertion over the past several days- not having any increased SOB. She has had some back pain.   Dyspnea:  Some worse x one week assoc with minimal cough that has improved Sleeping: couldn't breath as well so sleeping 45 degrees in recliner since onset of sob  SABA use: none  02: none  Mid/lower back pain is bilateral sym and not pleuritic at all  But not really  positional either. Thrush resolved p using arm and hammer toothpaste  p symbicort  rec Prednisone 5 mg x 2 daily x one week then 5 mg  One daily until return  Make sure you check your oxygen saturations at highest level of activity to be sure it stays over 90%  And call if trending down again Add cxr 10-15 % R PTX > f/u 48 h    03/26/2019  f/u ov/Mohit Zirbes re: sarcoid with small R PTX  Chief Complaint  Patient presents with  . Follow-up    cxr repeated today.   Dyspnea:  amb in house better on pred Cough: some in am/ dry  Sleeping: back in bed one pillow  SABA use: no extra  02: none  rec For cough > delsym 2 tsp every 12 hours as needed with goal of no cough at all  Wear 02 as much as you can 2lpm 24/7    04/02/2019  f/u ov/Marlow Berenguer re: sarcoidosis/ R ptx / rx pred down to 5 mg daily  Chief Complaint  Patient presents with  . Follow-up    Breathing has improved some on o2.    Dyspnea:  Denies but relatively sedentary  Cough: controlled with delsym Sleeping: no resp symptoms flat in bed/ one pillow SABA use: none  02: 2lpm 24/7    No obvious day to day or daytime variability or assoc excess/ purulent sputum or mucus plugs or hemoptysis or cp or chest tightness, subjective wheeze or overt sinus or hb symptoms.   Sleeping as abov e without nocturnal  or early am exacerbation  of respiratory  c/o's or need for noct saba. Also denies any obvious fluctuation of symptoms with weather or environmental changes or other aggravating or alleviating factors except as outlined above   No unusual exposure hx or h/o childhood pna/ asthma or knowledge of premature birth.  Current Allergies, Complete Past Medical History, Past Surgical History, Family History, and Social History were reviewed in [employer] record.  ROS  The following are not active complaints unless bolded Hoarseness, sore throat, dysphagia, dental problems, itching, sneezing,  nasal congestion or discharge  of excess mucus or purulent secretions, ear ache,   fever, chills, sweats, unintended wt loss or wt gain, classically pleuritic or exertional cp,  orthopnea pnd or arm/hand swelling  or leg swelling, presyncope, palpitations, abdominal pain, anorexia, nausea, vomiting, diarrhea  or change in bowel habits or change in bladder habits, change in stools or change in urine, dysuria, hematuria,  rash, arthralgias, visual complaints, headache, numbness, weakness or ataxia or problems with walking or coordination,  change in mood or  memory.        Current Meds  Medication Sig  . aspirin 81 MG tablet Take 81 mg by mouth daily.  . budesonide-formoterol (SYMBICORT) 160-4.5 MCG/ACT inhaler Inhale 2 puffs into the lungs 2 (two) times daily.  . cetirizine (ZYRTEC) 10 MG tablet Take 10 mg by mouth daily as needed.    . famotidine (PEPCID) 20 MG tablet Take 20 mg by mouth daily.  . Meloxicam 10 MG CAPS   . predniSONE (DELTASONE) 5 MG tablet 2 daily x one week then one daily  . telmisartan-hydrochlorothiazide (MICARDIS HCT) 80-25 MG per tablet Take 1/2 tab by mouth once daily                                        Past Medical History:  HYPERTENSION (ICD-401.9)  WEIGHT GAIN  -  Target wt = 179 for BMI < 30  GERD (ICD-530.81)  SARCOIDOSIS (ICD-135) Prednisone 2000 -2006 rash/cough...................................Marland KitchenWert  - Pred restarted daily 04/2007 > every third day dosing March 01, 2009 > taper off by May 14 2009 > flared by middle of May 2011 > resume prednisone May 28 2009 for sob/cough > tapered off sept 2020 back on 03/2019 for worse sob/cough /R PTX - dulera 100 for ? airway involvement > HFA 25% July 28, 2009 > 50% September 15, 2009, rx 200 dulera > pt using prn since 05/2010 s recurrent cough - HFA 75% 09/03/2011              Objective:   Physical Exam   pleasant elderly  w/f using rollator   04/02/2019   136  03/26/2019   136  03/24/2019   136 01/19/2019     144  09/28/2018   143  wt 188 July 27, 2008 > 177 July 28, 2009  > 178 08/10/2010  > 05/28/2011  179 > 09/03/2011  178 > 179 01/24/2012 > 06/22/2012  178 > 01/05/2013  176 > 07/13/2013  167> 12/16/13 166 > 06/21/2014   166 > 12/20/2014   167 > 08/11/2015   157 > 08/12/2016  158 >  08/12/2017  150>  08/17/2018  143      Vital signs reviewed  04/02/2019  - Note at rest 02 sats  97% on 2lpm       HEENT : pt wearing mask not removed for exam due to covid -19 concerns.    NECK :  without JVD/Nodes/TM/ nl carotid upstrokes bilaterally   LUNGS: no acc muscle use,  Nl contour chest  With scattered insp squeaks, exp rhonchi  bilaterally without cough on insp or exp maneuvers   CV:  RRR  no s3 or murmur or increase in P2, and no edema   ABD:  soft and nontender with nl inspiratory excursion in the supine position. No bruits or organomegaly appreciated, bowel sounds nl  MS:  Nl gait/ ext warm without deformities, calf tenderness, cyanosis or clubbing No obvious joint restrictions   SKIN: warm and dry without lesions    NEURO:  alert, approp, nl sensorium with  no motor or cerebellar deficits apparent.       CXR PA and Lateral:   04/02/2019 :    I personally reviewed images and agree with radiology impression as follows:   1. Decreasing right pneumothorax with small residual at the apex and laterally. 2. Underlying chronic lung disease, stable.       Assessment:

## 2019-04-04 ENCOUNTER — Encounter: Payer: Self-pay | Admitting: Internal Medicine

## 2019-04-04 NOTE — Assessment & Plan Note (Signed)
New dx 03/24/2019  > slt improved 03/26/2019 and placed on 24h 02 @ 2lpm   Improved on cxr, no symptoms so no intervention >  f/u in 4 weeks with cxr - call sooner if needed

## 2019-04-04 NOTE — Assessment & Plan Note (Signed)
03/26/2019  Patient Saturations on Room Air at Rest = 94% Patient Saturations on Room Air while Ambulating = 88% Patient Saturations on 2 Liters of oxygen while Ambulating = 95  As of 04/02/2019  > 2lpm 24/7 hs and prn daytime to keep > 90% at all times          Each maintenance medication was reviewed in detail including emphasizing most importantly the difference between maintenance and prns and under what circumstances the prns are to be triggered using an action plan format where appropriate.  Total time for H and P, chart review, counseling,  and generating customized AVS unique to this office visit / charting = 20 min

## 2019-04-04 NOTE — Assessment & Plan Note (Signed)
Onset of symptoms around 1995 / dx 2000     - Pred restarted daily 04/2007 > every third day dosing March 01, 2009 > taper off by May 14 2009 > flared by middle of May 2011 > resume prednisone May 28 2009 for sob/cough  - dulera 100 for ? airway involvement > HFA 25% July 28, 2009 > 50% September 15, 2009, rx 200 dulera > pt stopped 05/2010 s flare of symptoms - Reduced dose to 2.5 mg qod 02/25/2011  > adrenal insuff symptoms> resolved on 5 mg qod - PFT's 01/24/2012  FEV 1.28 (59%) 56 ratio and no better p B2, DLCO 83% - PFT's  08/11/2015  FEV1 1.14 (49 % ) ratio 59  p 6 % improvement from saba p nothing prior to study with DLCO  64/62 % corrects to 98  % for alv volume   - 08/17/2018   Walked RA x one lap =  approx 250 ft slow pace > stopped due to desats to 87% (walking more than she usually does ) while on pred 2.5 mg qod > d/c completely 09/15/2018 > mild nausea resolved p after 11/15/2018  - resumed daily prednisone 03/18/19 for desats with ex and calcium 10.7   The goal with a chronic steroid dependent illness is always arriving at the lowest effective dose that controls the disease/symptoms and not accepting a set "formula" which is based on statistics or guidelines that don't always take into account patient  variability or the natural hx of the dz in every individual patient, which may well vary over time.  For now therefore I recommend the patient maintain  10 mg ceiling/ 5 mg floor and f/u in 4 weeks re ptx

## 2019-04-23 ENCOUNTER — Other Ambulatory Visit: Payer: Self-pay | Admitting: Internal Medicine

## 2019-04-23 DIAGNOSIS — J939 Pneumothorax, unspecified: Secondary | ICD-10-CM

## 2019-04-26 ENCOUNTER — Ambulatory Visit (INDEPENDENT_AMBULATORY_CARE_PROVIDER_SITE_OTHER): Payer: Medicare HMO

## 2019-04-26 ENCOUNTER — Encounter: Payer: Self-pay | Admitting: Internal Medicine

## 2019-04-26 ENCOUNTER — Ambulatory Visit: Payer: Medicare HMO | Admitting: Internal Medicine

## 2019-04-26 ENCOUNTER — Other Ambulatory Visit: Payer: Self-pay

## 2019-04-26 DIAGNOSIS — D869 Sarcoidosis, unspecified: Secondary | ICD-10-CM | POA: Diagnosis not present

## 2019-04-26 DIAGNOSIS — J939 Pneumothorax, unspecified: Secondary | ICD-10-CM | POA: Diagnosis not present

## 2019-04-26 DIAGNOSIS — J9611 Chronic respiratory failure with hypoxia: Secondary | ICD-10-CM

## 2019-04-26 MED ORDER — BUDESONIDE-FORMOTEROL FUMARATE 160-4.5 MCG/ACT IN AERO: 2.0000 | INHALATION_SPRAY | Freq: Two times a day (BID) | RESPIRATORY_TRACT | 11 refills | Status: DC

## 2019-04-26 NOTE — Progress Notes (Signed)
Subjective:     Patient ID: Allison Osborn, female   DOB: August 14, 1945     MRN: [id number]    Brief patient profile:  47  yowf never smoker with a dx of sarcoid 2000  manifested clinically with both skin rash around ankles and cough and extreme fatigue and recurrent since diagnosis  although in retrospect she had previous parenchymal changes on cxr  suggestive of sarcoid dating back at least 5 years prior to dx - completely off prednisone as of 09/16/2018    History of Present Illness  01/24/2012 f/u ov/Latoria Dry cc all smiles, no limiting sob or cough. rec If cough or short ness of breath Dulera 200  Take 2 puffs first thing in am and then another 2 puffs about 12 hours later.  Prednisone 5 mg every other day is your floor.       12/16/2013 f/u ov/Larence Thone re: sarcoid cough is main issue/ no better on dulera 200  and worse if on less than pred 10 one half qod  Chief Complaint  Patient presents with  . Follow-up    Pt states overall doing well. She c/o PND and is taking zyrtec and this helps.   More active overall since last ov ,  Not limited by breathing from desired activities   Cough with activity outside, not noct, not productive rec For cough try the dulera 100 up to 2 every 12 hours to see if helps and if not ok to continue the 200  Work on inhaler technique:    For drainage take chlortrimeton (chlorpheniramine) 4 mg every 4 hours available over the counter (may cause drowsiness)      06/21/2014 f/u ov/Shaye Elling re: sarcoid/ pred 5 mg qod Chief Complaint  Patient presents with  . Follow-up    Pt states doing well and denies any co's today.   didn't really think the dulera helped cough which resolved ? On its own but also on ppi bid and prn antihistamines  rec Keep up with eye appts> did not do as of 12/20/2014 > reinforced     12/20/2014  f/u ov/Maggie Dworkin re: sarcoid / chronic cough on ppi bid resolved  Chief Complaint  Patient presents with  . Follow-up    Doing well and denies any co's.  She has not had to use Dulera.    No obvious daytime variabilty or assoc sob or  cp or chest tightness, subjective wheeze overt sinus or hb symptoms. No unusual exp hx  rec Try taking Try prilosec otc 20mg   Take 30-60 min before first meal of the day and Pepcid ac (famotidine) 20 mg one @  bedtime until cough is completely gone for at least a week without the need for cough suppression      08/12/2016  f/u ov/Aqueelah Cotrell re:  Sarcoidosis on prn dulera but has not used x one year/ pred 5 mg qod / pepcid at breakfast  Chief Complaint  Patient presents with  . Follow-up    Breathing is doing well. She has occ wheezing. She has not had to use Dulera.      doe = MMRC1 = can walk nl pace, flat grade, can't hurry or go uphills or steps s sob   Sleeps fine  Only took one course of pred of 20 mg over the last year and did not initiate dulera as instructed  rec If flare cough/ wheeze/ short of breath > dulera 200 2 puffs every 12 hours as needed Ok to leave off the  pepcid but for persistent cough > restart  Try prilosec otc 20mg   Take 30-60 min before first meal of the day and Pepcid ac (famotidine) 20 mg one @  bedtime until cough is completely gone for at least a week without the need for cough suppression Prednisone 5 mg every other day and if not improving after above measures ok to increase Prednisone to max of 20 mg per day until better then taper     08/12/2017  f/u ov/Draxton Luu re: sarcoidsis on symb 160 2bid and prednionse 5mg  qod  Chief Complaint  Patient presents with  . Follow-up    cxr done today. Breathing is doing well and no new co's.   Dyspnea:  MMRC1 = can walk nl pace, flat grade, can't hurry or go uphills or steps s sob   rec Work on inhaler technique:   Try prednisone 5 mg one half every other day    08/17/2018  f/u ov/Tyree Vandruff re: sarcoid on 5 mg one half qod  Chief Complaint  Patient presents with  . Follow-up    Breathing is doing well and no new co's.   Dyspnea:  Still = MMRC1 = can  walk nl pace, flat grade, can't hurry or go uphills or steps s sob   Cough: none Sleeping: able to lie flat / 1 pillows rec Try prednisone 2.5 mg every 3rd day for a month then stop - call if new symptoms (cough, short of breath, poor appetite)      09/28/2018  f/u ov/Joliana Claflin re: sarcoid with likely airway involvement  Dyspnea:  MMRC1 = can walk nl pace, flat grade, can't hurry or go uphills or steps s sob   Cough: minimal daytime non prod/sporadic  Sleeping: ok on side / bed if flat  rec Work on inhaler technique:   Call if nausea not better in about 4 weeks Make sure you check your oxygen saturations at highest level of activity   01/19/2019  f/u ov/Trell Secrist re: sarcoid off all prednisone since sept 2020  Chief Complaint  Patient presents with  . Follow-up    Pt states shes been doing pretty good. SOB on exertion. Occassional non productive cough and wheezing. Denies any fever or chills.  Dyspnea:  Housework including a little sob but never below 93% RA Cough: minimal, nothing noct Sleeping: bed if flat, on side  SABA use: none  02: none  rec Make sure you check your oxygen saturations at highest level of activity to be sure it stays over 90% and let me know if trending downward      03/24/2019  Acute ov/Kamaal Cast re: symb 160 Take 2 puffs first thing in am and then another 2 puffs about 12 hours later.  03/18/19 Called in  pred 20 x 3 days  20 x 3 days last dose 03/23/19 better to point of sats 90% RA with activity by day of ov Chief Complaint  Patient presents with  . Follow-up    She started to notice decreased o2 sats with exertion over the past several days- not having any increased SOB. She has had some back pain.   Dyspnea:  Some worse x one week assoc with minimal cough that has improved Sleeping: couldn't breath as well so sleeping 45 degrees in recliner since onset of sob  SABA use: none  02: none  Mid/lower back pain is bilateral sym and not pleuritic at all  But not really  positional either. Thrush resolved p using arm and hammer toothpaste  p symbicort  rec Prednisone 5 mg x 2 daily x one week then 5 mg  One daily until return  Make sure you check your oxygen saturations at highest level of activity to be sure it stays over 90%  And call if trending down again Add cxr 10-15 % R PTX > f/u 48 h    03/26/2019  f/u ov/Jediah Horger re: sarcoid with small R PTX  Chief Complaint  Patient presents with  . Follow-up    cxr repeated today.   Dyspnea:  amb in house better on pred Cough: some in am/ dry  Sleeping: back in bed one pillow  SABA use: no extra  02: none  rec For cough > delsym 2 tsp every 12 hours as needed with goal of no cough at all  Wear 02 as much as you can 2lpm 24/7    04/02/2019  f/u ov/Raylen Ken re: sarcoidosis/ R ptx / rx pred down to 5 mg daily  Chief Complaint  Patient presents with  . Follow-up    Breathing has improved some on o2.    Dyspnea:  Denies but relatively sedentary  Cough: controlled with delsym Sleeping: no resp symptoms flat in bed/ one pillow SABA use: none  02: 2lpm 24/7  rec No change rx   04/26/2019  f/u ov/Temple Ewart re: sarcoid / pred 5 mg daily  Chief Complaint  Patient presents with  . Follow-up    Breathing has improved since the last visit and she feels she has more energy. She is on pred 5 mg daily.   Dyspnea:  Across the lot/ [employer] sats down to 89% room air  Cough: none Sleeping: non resp symptoms SABA use: no 02: 2lpm at hs and prn daytime    No obvious day to day or daytime variability or assoc excess/ purulent sputum or mucus plugs or hemoptysis or cp or chest tightness, subjective wheeze or overt sinus or hb symptoms.   Sleeping as above  without nocturnal  or early am exacerbation  of respiratory  c/o's or need for noct saba. Also denies any obvious fluctuation of symptoms with weather or environmental changes or other aggravating or alleviating factors except as outlined above   No unusual exposure hx  or h/o childhood pna/ asthma or knowledge of premature birth.  Current Allergies, Complete Past Medical History, Past Surgical History, Family History, and Social History were reviewed in [employer] record.  ROS  The following are not active complaints unless bolded Hoarseness, sore throat, dysphagia, dental problems, itching, sneezing,  nasal congestion or discharge of excess mucus or purulent secretions, ear ache,   fever, chills, sweats, unintended wt loss or wt gain, classically pleuritic or exertional cp,  orthopnea pnd or arm/hand swelling  or leg swelling, presyncope, palpitations, abdominal pain, anorexia, nausea, vomiting, diarrhea  or change in bowel habits or change in bladder habits, change in stools or change in urine, dysuria, hematuria,  rash, arthralgias, visual complaints, headache, numbness, weakness or ataxia or problems with walking or coordination,  change in mood or  memory.        Current Meds  Medication Sig  . aspirin 81 MG tablet Take 81 mg by mouth daily.  . budesonide-formoterol (SYMBICORT) 160-4.5 MCG/ACT inhaler Inhale 2 puffs into the lungs 2 (two) times daily.  . cetirizine (ZYRTEC) 10 MG tablet Take 10 mg by mouth daily as needed.    . famotidine (PEPCID) 20 MG tablet Take 20 mg by mouth daily.  Marland Kitchen  Meloxicam 10 MG CAPS   . predniSONE (DELTASONE) 5 MG tablet 2 daily x one week then one daily  . telmisartan-hydrochlorothiazide (MICARDIS HCT) 80-25 MG per tablet Take 1/2 tab by mouth once daily                                 Past Medical History:  HYPERTENSION (ICD-401.9)  WEIGHT GAIN  - Target wt = 179 for BMI < 30  GERD (ICD-530.81)  SARCOIDOSIS (ICD-135) Prednisone 2000 -2006 rash/cough...................................Marland KitchenWert  - Pred restarted daily 04/2007 > every third day dosing March 01, 2009 > taper off by May 14 2009 > flared by middle of May 2011 > resume prednisone May 28 2009 for sob/cough > tapered off sept 2020  back on 03/2019 for worse sob/cough /R PTX - dulera 100 for ? airway involvement > HFA 25% July 28, 2009 > 50% September 15, 2009, rx 200 dulera > pt using prn since 05/2010 s recurrent cough - HFA 75% 09/03/2011              Objective:   Physical Exam  Pleasant amb wf nad   04/26/2019    136  04/02/2019   136  03/26/2019   136  03/24/2019   136 01/19/2019     144  09/28/2018  143  wt 188 July 27, 2008 > 177 July 28, 2009  > 178 08/10/2010  > 05/28/2011  179 > 09/03/2011  178 > 179 01/24/2012 > 06/22/2012  178 > 01/05/2013  176 > 07/13/2013  167> 12/16/13 166 > 06/21/2014   166 > 12/20/2014   167 > 08/11/2015   157 > 08/12/2016  158 >  08/12/2017  150>  08/17/2018  143         Vital signs reviewed  04/26/2019  - Note at rest 02 sats  99% on RA      With scattered insp squeaks, exp rhonchi  bilaterally without cough on insp or exp maneuvers      CXR PA and Lateral:   04/26/2019 :    I personally reviewed images and agree with radiology impression as follows:    1. Persistent trace right apical pneumothorax, volume estimated less than 5%. This may reflect trapped lung given the extensive underlying scarring and fibrosis.      Assessment:

## 2019-04-26 NOTE — Patient Instructions (Addendum)
Prednisone 5 mg one daily and symbicort 160 Take 2 puffs first thing in am and then another 2 puffs about 12 hours later.    Please schedule a follow up office visit in 6 weeks, call sooner if needed with CXR

## 2019-04-27 ENCOUNTER — Encounter: Payer: Self-pay | Admitting: Internal Medicine

## 2019-04-27 NOTE — Assessment & Plan Note (Signed)
Onset of symptoms around 1995 / dx 2000     - Pred restarted daily 04/2007 > every third day dosing March 01, 2009 > taper off by May 14 2009 > flared by middle of May 2011 > resume prednisone May 28 2009 for sob/cough  - dulera 100 for ? airway involvement > HFA 25% July 28, 2009 > 50% September 15, 2009, rx 200 dulera > pt stopped 05/2010 s flare of symptoms - Reduced dose to 2.5 mg qod 02/25/2011  > adrenal insuff symptoms> resolved on 5 mg qod - PFT's 01/24/2012  FEV 1.28 (59%) 56 ratio and no better p B2, DLCO 83% - PFT's  08/11/2015  FEV1 1.14 (49 % ) ratio 59  p 6 % improvement from saba p nothing prior to study with DLCO  64/62 % corrects to 98  % for alv volume   - 08/17/2018   Walked RA x one lap =  approx 250 ft slow pace > stopped due to desats to 87% (walking more than she usually does ) while on pred 2.5 mg qod > d/c completely 09/15/2018 > mild nausea resolved p after 11/15/2018  - resumed daily prednisone 03/18/19 for desats with ex and calcium 10.7   The goal with a chronic steroid dependent illness is always arriving at the lowest effective dose that controls the disease/symptoms and not accepting a set "formula" which is based on statistics or guidelines that don't always take into account patient  variability or the natural hx of the dz in every individual patient, which may well vary over time.  For now therefore I recommend the patient maintain  pred at 5 mg plus ICS = symb 160 2bid for airway involvement

## 2019-04-27 NOTE — Assessment & Plan Note (Signed)
New dx 03/24/2019  > slt improved 03/26/2019 and placed on 24h 02 @ 2lpm   Suspect trapped lung at this point as suggested by radiology/ no need for 02 to re-expand.    >>> f/u with cxr in 6 weeks

## 2019-04-27 NOTE — Assessment & Plan Note (Signed)
03/26/2019 qualification: Patient Saturations on Room Air at Rest = 94% Patient Saturations on [employer] while Ambulating = 88% Patient Saturations on 2 Liters of oxygen while Ambulating = 95 - no desats walking 04/26/2019 > rec  use 02 prn    Did not qualify for POC today          Each maintenance medication was reviewed in detail including emphasizing most importantly the difference between maintenance and prns and under what circumstances the prns are to be triggered using an action plan format where appropriate.  Total time for H and P, chart review, counseling, . directly observing portions of ambulatory 02 saturation study/ and generating customized AVS unique to this office visit / charting = 30 min

## 2019-05-19 ENCOUNTER — Telehealth: Payer: Self-pay | Admitting: Internal Medicine

## 2019-05-19 NOTE — Telephone Encounter (Signed)
Checked MW's cubby up front and in A pod, and did not see any forms on this patient. Synetta Fail please advise if you have a CMN for this pt's POC, or if we need to re-request this information. Thanks!

## 2019-05-19 NOTE — Telephone Encounter (Signed)
I do have the form from [employer] for Dr. Sherene Sires to sign

## 2019-05-24 NOTE — Telephone Encounter (Signed)
This form has been signed by Dr. Sherene Sires and has been faxed to [employer]. I did receive confirmation that the fax was received

## 2019-06-07 ENCOUNTER — Encounter: Payer: Self-pay | Admitting: Internal Medicine

## 2019-06-07 ENCOUNTER — Other Ambulatory Visit: Payer: Self-pay | Admitting: Internal Medicine

## 2019-06-07 ENCOUNTER — Ambulatory Visit (INDEPENDENT_AMBULATORY_CARE_PROVIDER_SITE_OTHER): Payer: Medicare HMO

## 2019-06-07 ENCOUNTER — Other Ambulatory Visit: Payer: Self-pay

## 2019-06-07 ENCOUNTER — Ambulatory Visit: Payer: Medicare HMO | Admitting: Internal Medicine

## 2019-06-07 DIAGNOSIS — J9611 Chronic respiratory failure with hypoxia: Secondary | ICD-10-CM

## 2019-06-07 DIAGNOSIS — J939 Pneumothorax, unspecified: Secondary | ICD-10-CM

## 2019-06-07 DIAGNOSIS — D869 Sarcoidosis, unspecified: Secondary | ICD-10-CM

## 2019-06-07 NOTE — Patient Instructions (Signed)
Prednisone 5 mg one daily - if any worse go to 2 daily until better then back to  1 daily   Please schedule a follow up visit in 3 months but call sooner if needed

## 2019-06-07 NOTE — Progress Notes (Signed)
Subjective:     Patient ID: Allison Osborn, female   DOB: August 14, 1945     MRN: [id number]    Brief patient profile:  47  yowf never smoker with a dx of sarcoid 2000  manifested clinically with both skin rash around ankles and cough and extreme fatigue and recurrent since diagnosis  although in retrospect she had previous parenchymal changes on cxr  suggestive of sarcoid dating back at least 5 years prior to dx - completely off prednisone as of 09/16/2018    History of Present Illness  01/24/2012 f/u ov/Allison Osborn cc all smiles, no limiting sob or cough. rec If cough or short ness of breath Dulera 200  Take 2 puffs first thing in am and then another 2 puffs about 12 hours later.  Prednisone 5 mg every other day is your floor.       12/16/2013 f/u ov/Allison Osborn re: sarcoid cough is main issue/ no better on dulera 200  and worse if on less than pred 10 one half qod  Chief Complaint  Patient presents with  . Follow-up    Pt states overall doing well. She c/o PND and is taking zyrtec and this helps.   More active overall since last ov ,  Not limited by breathing from desired activities   Cough with activity outside, not noct, not productive rec For cough try the dulera 100 up to 2 every 12 hours to see if helps and if not ok to continue the 200  Work on inhaler technique:    For drainage take chlortrimeton (chlorpheniramine) 4 mg every 4 hours available over the counter (may cause drowsiness)      06/21/2014 f/u ov/Allison Osborn re: sarcoid/ pred 5 mg qod Chief Complaint  Patient presents with  . Follow-up    Pt states doing well and denies any co's today.   didn't really think the dulera helped cough which resolved ? On its own but also on ppi bid and prn antihistamines  rec Keep up with eye appts> did not do as of 12/20/2014 > reinforced     12/20/2014  f/u ov/Allison Osborn re: sarcoid / chronic cough on ppi bid resolved  Chief Complaint  Patient presents with  . Follow-up    Doing well and denies any co's.  She has not had to use Dulera.    No obvious daytime variabilty or assoc sob or  cp or chest tightness, subjective wheeze overt sinus or hb symptoms. No unusual exp hx  rec Try taking Try prilosec otc 20mg   Take 30-60 min before first meal of the day and Pepcid ac (famotidine) 20 mg one @  bedtime until cough is completely gone for at least a week without the need for cough suppression      08/12/2016  f/u ov/Allison Osborn re:  Sarcoidosis on prn dulera but has not used x one year/ pred 5 mg qod / pepcid at breakfast  Chief Complaint  Patient presents with  . Follow-up    Breathing is doing well. She has occ wheezing. She has not had to use Dulera.      doe = MMRC1 = can walk nl pace, flat grade, can't hurry or go uphills or steps s sob   Sleeps fine  Only took one course of pred of 20 mg over the last year and did not initiate dulera as instructed  rec If flare cough/ wheeze/ short of breath > dulera 200 2 puffs every 12 hours as needed Ok to leave off the  pepcid but for persistent cough > restart  Try prilosec otc 20mg   Take 30-60 min before first meal of the day and Pepcid ac (famotidine) 20 mg one @  bedtime until cough is completely gone for at least a week without the need for cough suppression Prednisone 5 mg every other day and if not improving after above measures ok to increase Prednisone to max of 20 mg per day until better then taper     08/12/2017  f/u ov/Allison Osborn re: sarcoidsis on symb 160 2bid and prednionse 5mg  qod  Chief Complaint  Patient presents with  . Follow-up    cxr done today. Breathing is doing well and no new co's.   Dyspnea:  MMRC1 = can walk nl pace, flat grade, can't hurry or go uphills or steps s sob   rec Work on inhaler technique:   Try prednisone 5 mg one half every other day    08/17/2018  f/u ov/Allison Osborn re: sarcoid on 5 mg one half qod  Chief Complaint  Patient presents with  . Follow-up    Breathing is doing well and no new co's.   Dyspnea:  Still = MMRC1 = can  walk nl pace, flat grade, can't hurry or go uphills or steps s sob   Cough: none Sleeping: able to lie flat / 1 pillows rec Try prednisone 2.5 mg every 3rd day for a month then stop - call if new symptoms (cough, short of breath, poor appetite)      09/28/2018  f/u ov/Allison Osborn re: sarcoid with likely airway involvement  Dyspnea:  MMRC1 = can walk nl pace, flat grade, can't hurry or go uphills or steps s sob   Cough: minimal daytime non prod/sporadic  Sleeping: ok on side / bed if flat  rec Work on inhaler technique:   Call if nausea not better in about 4 weeks Make sure you check your oxygen saturations at highest level of activity   01/19/2019  f/u ov/Allison Osborn re: sarcoid off all prednisone since sept 2020  Chief Complaint  Patient presents with  . Follow-up    Pt states shes been doing pretty good. SOB on exertion. Occassional non productive cough and wheezing. Denies any fever or chills.  Dyspnea:  Housework including a little sob but never below 93% RA Cough: minimal, nothing noct Sleeping: bed if flat, on side  SABA use: none  02: none  rec Make sure you check your oxygen saturations at highest level of activity to be sure it stays over 90% and let me know if trending downward      03/24/2019  Acute ov/Allison Osborn re: symb 160 Take 2 puffs first thing in am and then another 2 puffs about 12 hours later.  03/18/19 Called in  pred 20 x 3 days  20 x 3 days last dose 03/23/19 better to point of sats 90% RA with activity by day of ov Chief Complaint  Patient presents with  . Follow-up    She started to notice decreased o2 sats with exertion over the past several days- not having any increased SOB. She has had some back pain.   Dyspnea:  Some worse x one week assoc with minimal cough that has improved Sleeping: couldn't breath as well so sleeping 45 degrees in recliner since onset of sob  SABA use: none  02: none  Mid/lower back pain is bilateral sym and not pleuritic at all  But not really  positional either. Thrush resolved p using arm and hammer toothpaste  p symbicort  rec Prednisone 5 mg x 2 daily x one week then 5 mg  One daily until return  Make sure you check your oxygen saturations at highest level of activity to be sure it stays over 90%  And call if trending down again Add cxr 10-15 % R PTX > f/u 48 h    03/26/2019  f/u ov/Allison Osborn re: sarcoid with small R PTX  Chief Complaint  Patient presents with  . Follow-up    cxr repeated today.   Dyspnea:  amb in house better on pred Cough: some in am/ dry  Sleeping: back in bed one pillow  SABA use: no extra  02: none  rec For cough > delsym 2 tsp every 12 hours as needed with goal of no cough at all  Wear 02 as much as you can 2lpm 24/7    04/02/2019  f/u ov/Allison Osborn re: sarcoidosis/ R ptx / rx pred down to 5 mg daily  Chief Complaint  Patient presents with  . Follow-up    Breathing has improved some on o2.    Dyspnea:  Denies but relatively sedentary  Cough: controlled with delsym Sleeping: no resp symptoms flat in bed/ one pillow SABA use: none  02: 2lpm 24/7  rec No change rx   04/26/2019  f/u ov/Allison Osborn re: sarcoid / pred 5 mg daily  Chief Complaint  Patient presents with  . Follow-up    Breathing has improved since the last visit and she feels she has more energy. She is on pred 5 mg daily.   Dyspnea:  Across the lot/ [employer] sats down to 89% room air  Cough: none Sleeping: non resp symptoms SABA use: no 02: 2lpm at hs and prn daytime  rec Prednisone 5 mg one daily and symbicort 160 Take 2 puffs first thing in am and then another 2 puffs about 12 hours later.  Please schedule a follow up office visit in 6 weeks, call sooner if needed with CXR   06/07/2019  f/u ov/Allison Osborn re: sarcoid/ still on 5 mg pred daily  Chief Complaint  Patient presents with  . Follow-up    pt has sob when doing activities  Dyspnea:  Walking up and down the street at [facility] [address]  since last ov / no 02 needed  Cough:  none Sleeping: no resp symptoms flat/ occ one pillow SABA use: none 02: none at all    No obvious day to day or daytime variability or assoc excess/ purulent sputum or mucus plugs or hemoptysis or cp or chest tightness, subjective wheeze or overt sinus or hb symptoms.   Sleeping  without nocturnal  or early am exacerbation  of respiratory  c/o's or need for noct saba. Also denies any obvious fluctuation of symptoms with weather or environmental changes or other aggravating or alleviating factors except as outlined above   No unusual exposure hx or h/o childhood pna/ asthma or knowledge of premature birth.  Current Allergies, Complete Past Medical History, Past Surgical History, Family History, and Social History were reviewed in [employer] record.  ROS  The following are not active complaints unless bolded Hoarseness, sore throat, dysphagia, dental problems, itching, sneezing,  nasal congestion or discharge of excess mucus or purulent secretions, ear ache,   fever, chills, sweats, unintended wt loss or wt gain, classically pleuritic or exertional cp,  orthopnea pnd or arm/hand swelling  or leg swelling, presyncope, palpitations, abdominal pain, anorexia, nausea, vomiting, diarrhea  or change  in bowel habits or change in bladder habits, change in stools or change in urine, dysuria, hematuria,  rash, arthralgias, visual complaints, headache, numbness, weakness or ataxia or problems with walking or coordination,  change in mood or  memory.        Current Meds  Medication Sig  . aspirin 81 MG tablet Take 81 mg by mouth daily.  . budesonide-formoterol (SYMBICORT) 160-4.5 MCG/ACT inhaler Inhale 2 puffs into the lungs 2 (two) times daily.  . cetirizine (ZYRTEC) 10 MG tablet Take 10 mg by mouth daily as needed.    . famotidine (PEPCID) 20 MG tablet Take 20 mg by mouth daily.  . Meloxicam 10 MG CAPS   . predniSONE (DELTASONE) 5 MG tablet 2 daily x one week then one daily  .  telmisartan-hydrochlorothiazide (MICARDIS HCT) 80-25 MG per tablet Take 1/2 tab by mouth once daily                                  Past Medical History:  HYPERTENSION (ICD-401.9)  WEIGHT GAIN  - Target wt = 179 for BMI < 30  GERD (ICD-530.81)  SARCOIDOSIS (ICD-135)  rash/cough...................................Marland KitchenWert                Objective:   Physical Exam  Pleasant amb wf  nad     06/07/2019    138  04/26/2019    136  04/02/2019   136  03/26/2019   136  03/24/2019   136 01/19/2019     144  09/28/2018  143  wt 188 July 27, 2008 > 177 July 28, 2009  > 178 08/10/2010  > 05/28/2011  179 > 09/03/2011  178 > 179 01/24/2012 > 06/22/2012  178 > 01/05/2013  176 > 07/13/2013  167> 12/16/13 166 > 06/21/2014   166 > 12/20/2014   167 > 08/11/2015   157 > 08/12/2016  158 >  08/12/2017  150>  08/17/2018  143      Vital signs reviewed  06/07/2019  - Note at rest 02 sats  97% on RA     HEENT : pt wearing mask not removed for exam due to covid -19 concerns.    NECK :  without JVD/Nodes/TM/ nl carotid upstrokes bilaterally   LUNGS: no acc muscle use,  Nl contour chest with coarse bs esp upper lobes  bilaterally without cough on insp or exp maneuvers   CV:  RRR  no s3 or murmur or increase in P2, and no edema   ABD:  soft and nontender with nl inspiratory excursion in the supine position. No bruits or organomegaly appreciated, bowel sounds nl  MS:  Nl gait/ ext warm without deformities, calf tenderness, cyanosis or clubbing No obvious joint restrictions   SKIN: warm and dry without lesions    NEURO:  alert, approp, nl sensorium with  no motor or cerebellar deficits apparent.        CXR PA and Lateral:   06/07/2019 :    I personally reviewed images and agree with radiology impression as follows:   1. Thin radiodense line in the right apex may outline portion of a residual right apical pneumothorax or possibly reflect additional scarring and architectural distortion in the right apex.  Difficult to ascertain accurately though favor the latter. 2. Extensive background of scarring and fibrotic changes throughout the lungs, grossly similar to comparison studies.       Assessment:

## 2019-06-08 ENCOUNTER — Encounter: Payer: Self-pay | Admitting: Internal Medicine

## 2019-06-08 NOTE — Assessment & Plan Note (Signed)
Onset of symptoms around 1995 / dx 2000     - Pred restarted daily 04/2007 > every third day dosing March 01, 2009 > taper off by May 14 2009 > flared by middle of May 2011 > resume prednisone May 28 2009 for sob/cough  - dulera 100 for ? airway involvement > HFA 25% July 28, 2009 > 50% September 15, 2009, rx 200 dulera > pt stopped 05/2010 s flare of symptoms - Reduced dose to 2.5 mg qod 02/25/2011  > adrenal insuff symptoms> resolved on 5 mg qod - PFT's 01/24/2012  FEV 1.28 (59%) 56 ratio and no better p B2, DLCO 83% - PFT's  08/11/2015  FEV1 1.14 (49 % ) ratio 59  p 6 % improvement from saba p nothing prior to study with DLCO  64/62 % corrects to 98  % for alv volume   - 08/17/2018   Walked RA x one lap =  approx 250 ft slow pace > stopped due to desats to 87% (walking more than she usually does ) while on pred 2.5 mg qod > d/c completely 09/15/2018 > mild nausea resolved p after 11/15/2018  - resumed daily prednisone 03/18/19 for desats with ex and calcium 10.7   The goal with a chronic steroid dependent illness is always arriving at the lowest effective dose that controls the disease/symptoms and not accepting a set "formula" which is based on statistics or guidelines that don't always take into account patient  variability or the natural hx of the dz in every individual patient, which may well vary over time.  For now therefore I recommend the patient maintain  5 mg floor with option of 10 mg daily if any worse cough/ sob

## 2019-06-08 NOTE — Assessment & Plan Note (Signed)
03/26/2019 qualification: Patient Saturations on Room Air at Rest = 94% Patient Saturations on [employer] while Ambulating = 88% Patient Saturations on 2 Liters of oxygen while Ambulating = 95 - no desats walking 04/26/2019 > rec  use 02 prn   - d/c'd all 02 06/07/2019          Each maintenance medication was reviewed in detail including emphasizing most importantly the difference between maintenance and prns and under what circumstances the prns are to be triggered using an action plan format where appropriate.  Total time for H and P, chart review, counseling,  and generating customized AVS unique to this office visit / charting = 20 min

## 2019-06-08 NOTE — Assessment & Plan Note (Signed)
New dx 03/24/2019  > slt improved 03/26/2019 and placed on 24h 02 @ 2lpm   No def ptx on present cxr  > f/u prn

## 2019-07-20 ENCOUNTER — Ambulatory Visit: Payer: Medicare HMO | Admitting: Internal Medicine

## 2019-08-16 ENCOUNTER — Other Ambulatory Visit: Payer: Self-pay | Admitting: Internal Medicine

## 2019-09-07 ENCOUNTER — Ambulatory Visit: Payer: Medicare HMO | Admitting: Internal Medicine

## 2019-09-13 ENCOUNTER — Ambulatory Visit: Payer: Medicare HMO | Admitting: Internal Medicine

## 2019-09-13 ENCOUNTER — Ambulatory Visit (INDEPENDENT_AMBULATORY_CARE_PROVIDER_SITE_OTHER): Payer: Medicare HMO

## 2019-09-13 ENCOUNTER — Encounter: Payer: Self-pay | Admitting: Internal Medicine

## 2019-09-13 ENCOUNTER — Other Ambulatory Visit: Payer: Self-pay

## 2019-09-13 DIAGNOSIS — D869 Sarcoidosis, unspecified: Secondary | ICD-10-CM

## 2019-09-13 DIAGNOSIS — J939 Pneumothorax, unspecified: Secondary | ICD-10-CM

## 2019-09-13 NOTE — Patient Instructions (Signed)
Please remember to go to the  x-ray department  for your tests - we will call you with the results when they are available     Please schedule a follow up visit in 3 months but call sooner if needed  

## 2019-09-13 NOTE — Assessment & Plan Note (Addendum)
New dx 03/24/2019  > slt improved 03/26/2019  > not viz 09/13/2019   No further w/u needed / advised re sudden change sob/ r cp would suggest recurrence > to er asap  F/u otherwise q 3 m         Each maintenance medication was reviewed in detail including emphasizing most importantly the difference between maintenance and prns and under what circumstances the prns are to be triggered using an action plan format where appropriate.  Total time for H and P, chart review, counseling,  and generating customized AVS unique to this office visit / charting = 21 min

## 2019-09-13 NOTE — Progress Notes (Addendum)
Subjective:     Patient ID: Allison Osborn, female   DOB: 1945/12/07     MRN: [id number]    Brief patient profile:  [id number] [id number] never smoker with a dx of sarcoid 2000  manifested clinically with both skin rash around ankles and cough and extreme fatigue and recurrent since diagnosis  although in retrospect she had previous parenchymal changes on cxr  suggestive of sarcoid dating back at least 5 years prior to dx - completely off prednisone as of 09/16/2018    History of Present Illness  01/24/2012 f/u ov/Francys Bolin cc all smiles, no limiting sob or cough. rec If cough or short ness of breath Dulera 200  Take 2 puffs first thing in am and then another 2 puffs about 12 hours later.  Prednisone 5 mg every other day is your floor.       12/16/2013 f/u ov/Geneviene Tesch re: sarcoid cough is main issue/ no better on dulera 200  and worse if on less than pred 10 one half qod  Chief Complaint  Patient presents with  . Follow-up    Pt states overall doing well. She c/o PND and is taking zyrtec and this helps.   More active overall since last ov ,  Not limited by breathing from desired activities   Cough with activity outside, not noct, not productive rec For cough try the dulera 100 up to 2 every 12 hours to see if helps and if not ok to continue the 200  Work on inhaler technique:    For drainage take chlortrimeton (chlorpheniramine) 4 mg every 4 hours available over the counter (may cause drowsiness)      06/21/2014 f/u ov/Aki Burdin re: sarcoid/ pred 5 mg qod Chief Complaint  Patient presents with  . Follow-up    Pt states doing well and denies any co's today.   didn't really think the dulera helped cough which resolved ? On its own but also on ppi bid and prn antihistamines  rec Keep up with eye appts> did not do as of 12/20/2014 > reinforced     12/20/2014  f/u ov/Josiane Labine re: sarcoid / chronic cough on ppi bid resolved  Chief Complaint  Patient presents with  . Follow-up    Doing well and denies any co's. She  has not had to use Dulera.    No obvious daytime variabilty or assoc sob or  cp or chest tightness, subjective wheeze overt sinus or hb symptoms. No unusual exp hx  rec Try taking Try prilosec otc 20mg   Take 30-60 min before first meal of the day and Pepcid ac (famotidine) 20 mg one @  bedtime until cough is completely gone for at least a week without the need for cough suppression      08/12/2016  f/u ov/Takeisha Cianci re:  Sarcoidosis on prn dulera but has not used x one year/ pred 5 mg qod / pepcid at breakfast  Chief Complaint  Patient presents with  . Follow-up    Breathing is doing well. She has occ wheezing. She has not had to use Dulera.      doe = MMRC1 = can walk nl pace, flat grade, can't hurry or go uphills or steps s sob   Sleeps fine  Only took one course of pred of 20 mg over the last year and did not initiate dulera as instructed  rec If flare cough/ wheeze/ short of breath > dulera 200 2 puffs every 12 hours as needed Ok to leave off the pepcid  but for persistent cough > restart  Try prilosec otc 20mg   Take 30-60 min before first meal of the day and Pepcid ac (famotidine) 20 mg one @  bedtime until cough is completely gone for at least a week without the need for cough suppression Prednisone 5 mg every other day and if not improving after above measures ok to increase Prednisone to max of 20 mg per day until better then taper     08/12/2017  f/u ov/Chelcey Caputo re: sarcoidsis on symb 160 2bid and prednionse 5mg  qod  Chief Complaint  Patient presents with  . Follow-up    cxr done today. Breathing is doing well and no new co's.   Dyspnea:  MMRC1 = can walk nl pace, flat grade, can't hurry or go uphills or steps s sob   rec Work on inhaler technique:   Try prednisone 5 mg one half every other day    08/17/2018  f/u ov/Kaslyn Richburg re: sarcoid on 5 mg one half qod  Chief Complaint  Patient presents with  . Follow-up    Breathing is doing well and no new co's.   Dyspnea:  Still = MMRC1 = can  walk nl pace, flat grade, can't hurry or go uphills or steps s sob   Cough: none Sleeping: able to lie flat / 1 pillows rec Try prednisone 2.5 mg every 3rd day for a month then stop - call if new symptoms (cough, short of breath, poor appetite)      09/28/2018  f/u ov/Leolia Vinzant re: sarcoid with likely airway involvement  Dyspnea:  MMRC1 = can walk nl pace, flat grade, can't hurry or go uphills or steps s sob   Cough: minimal daytime non prod/sporadic  Sleeping: ok on side / bed if flat  rec Work on inhaler technique:   Call if nausea not better in about 4 weeks Make sure you check your oxygen saturations at highest level of activity   01/19/2019  f/u ov/Nilah Belcourt re: sarcoid off all prednisone since sept 2020  Chief Complaint  Patient presents with  . Follow-up    Pt states shes been doing pretty good. SOB on exertion. Occassional non productive cough and wheezing. Denies any fever or chills.  Dyspnea:  Housework including a little sob but never below 93% RA Cough: minimal, nothing noct Sleeping: bed if flat, on side  SABA use: none  02: none  rec Make sure you check your oxygen saturations at highest level of activity to be sure it stays over 90% and let me know if trending downward      03/24/2019  Acute ov/Chaise Mahabir re: symb 160 Take 2 puffs first thing in am and then another 2 puffs about 12 hours later.  03/18/19 Called in  pred 20 x 3 days  20 x 3 days last dose 03/23/19 better to point of sats 90% RA with activity by day of ov Chief Complaint  Patient presents with  . Follow-up    She started to notice decreased o2 sats with exertion over the past several days- not having any increased SOB. She has had some back pain.   Dyspnea:  Some worse x one week assoc with minimal cough that has improved Sleeping: couldn't breath as well so sleeping 45 degrees in recliner since onset of sob  SABA use: none  02: none  Mid/lower back pain is bilateral sym and not pleuritic at all  But not really  positional either. Thrush resolved p using arm and hammer toothpaste p  symbicort  rec Prednisone 5 mg x 2 daily x one week then 5 mg  One daily until return  Make sure you check your oxygen saturations at highest level of activity to be sure it stays over 90%  And call if trending down again Add cxr 10-15 % R PTX > f/u 48 h    03/26/2019  f/u ov/Macyn Remmert re: sarcoid with small R PTX  Chief Complaint  Patient presents with  . Follow-up    cxr repeated today.   Dyspnea:  amb in house better on pred Cough: some in am/ dry  Sleeping: back in bed one pillow  SABA use: no extra  02: none  rec For cough > delsym 2 tsp every 12 hours as needed with goal of no cough at all  Wear 02 as much as you can 2lpm 24/7    04/02/2019  f/u ov/Ranika Mcniel re: sarcoidosis/ R ptx / rx pred down to 5 mg daily  Chief Complaint  Patient presents with  . Follow-up    Breathing has improved some on o2.    Dyspnea:  Denies but relatively sedentary  Cough: controlled with delsym Sleeping: no resp symptoms flat in bed/ one pillow SABA use: none  02: 2lpm 24/7  rec No change rx   04/26/2019  f/u ov/Tava Peery re: sarcoid / pred 5 mg daily  Chief Complaint  Patient presents with  . Follow-up    Breathing has improved since the last visit and she feels she has more energy. She is on pred 5 mg daily.   Dyspnea:  Across the lot/ [employer] sats down to 89% room air  Cough: none Sleeping: non resp symptoms SABA use: no 02: 2lpm at hs and prn daytime  rec Prednisone 5 mg one daily and symbicort 160 Take 2 puffs first thing in am and then another 2 puffs about 12 hours later.  Please schedule a follow up office visit in 6 weeks, call sooner if needed with CXR   06/07/2019  f/u ov/Audre Cenci re: sarcoid/ still on 5 mg pred daily  Chief Complaint  Patient presents with  . Follow-up    pt has sob when doing activities  Dyspnea:  Walking up and down the street at [facility] [address]  since last ov / no 02 needed  Cough:  none Sleeping: no resp symptoms flat/ occ one pillow SABA use: none 02: none at all  rec Prednisone 5 mg one daily - if any worse go to 2 daily until better then back to  1 daily   09/13/2019  f/u ov/Aran Menning re: sarcoid/ pred 5 and symb 160 2bid c/b R PTX Chief Complaint  Patient presents with  . Follow-up    Patient denies any problems  Dyspnea:  Limited more by breathing Cough: no Sleeping: flat bed/ one pillow SABA use: none  02: none    No obvious day to day or daytime variability or assoc excess/ purulent sputum or mucus plugs or hemoptysis or cp or chest tightness, subjective wheeze or overt sinus or hb symptoms.   Sleeping  without nocturnal  or early am exacerbation  of respiratory  c/o's or need for noct saba. Also denies any obvious fluctuation of symptoms with weather or environmental changes or other aggravating or alleviating factors except as outlined above   No unusual exposure hx or h/o childhood pna/ asthma or knowledge of premature birth.  Current Allergies, Complete Past Medical History, Past Surgical History, Family History, and Social History were reviewed  in [employer] record.  ROS  The following are not active complaints unless bolded Hoarseness, sore throat, dysphagia, dental problems, itching, sneezing,  nasal congestion or discharge of excess mucus or purulent secretions, ear ache,   fever, chills, sweats, unintended wt loss or wt gain, classically pleuritic or exertional cp,  orthopnea pnd or arm/hand swelling  or leg swelling, presyncope, palpitations, abdominal pain, anorexia, nausea, vomiting, diarrhea  or change in bowel habits or change in bladder habits, change in stools or change in urine, dysuria, hematuria,  rash, arthralgias, visual complaints, headache, numbness, weakness or ataxia or problems with walking/uses rollator or coordination,  change in mood or  memory.        Current Meds  Medication Sig  . aspirin 81 MG tablet  Take 81 mg by mouth daily.  . budesonide-formoterol (SYMBICORT) 160-4.5 MCG/ACT inhaler Inhale 2 puffs into the lungs 2 (two) times daily.  . cetirizine (ZYRTEC) 10 MG tablet Take 10 mg by mouth daily as needed.    . famotidine (PEPCID) 20 MG tablet Take 20 mg by mouth daily.  . Meloxicam 10 MG CAPS   . predniSONE (DELTASONE) 5 MG tablet Take one tablet daily as directed  . telmisartan-hydrochlorothiazide (MICARDIS HCT) 80-25 MG per tablet Take 1/2 tab by mouth once daily                                 Past Medical History:  HYPERTENSION (ICD-401.9)  WEIGHT GAIN  - Target wt = 179 for BMI < 30  GERD (ICD-530.81)  SARCOIDOSIS (ICD-135)  rash/cough...................................Marland KitchenWert                Objective:   Physical Exam  amb wf nad using rollator   09/13/2019    139 06/07/2019    138  04/26/2019    136  04/02/2019   136  03/26/2019   136  03/24/2019   136 01/19/2019     144  09/28/2018  143  wt 188 July 27, 2008 > 177 July 28, 2009  > 178 08/10/2010  > 05/28/2011  179 > 09/03/2011  178 > 179 01/24/2012 > 06/22/2012  178 > 01/05/2013  176 > 07/13/2013  167> 12/16/13 166 > 06/21/2014   166 > 12/20/2014   167 > 08/11/2015   157 > 08/12/2016  158 >  08/12/2017  150>  08/17/2018  143     Vital signs reviewed  09/13/2019  - Note at rest 02 sats  97% on RA       HEENT : pt wearing mask not removed for exam due to covid -19 concerns.    NECK :  without JVD/Nodes/TM/ nl carotid upstrokes bilaterally   LUNGS: no acc muscle use,  Nl contour chest with a few insp pops/ squeaks bilaterally without cough on insp or exp maneuvers   CV:  RRR  no s3 or murmur or increase in P2, and no edema   ABD:  soft and nontender with nl inspiratory excursion in the supine position. No bruits or organomegaly appreciated, bowel sounds nl  MS:  Nl gait/ ext warm without deformities, calf tenderness, cyanosis or clubbing No obvious joint restrictions   SKIN: warm and dry without lesions    NEURO:   alert, approp, nl sensorium with  no motor or cerebellar deficits apparent.      CXR PA and Lateral:   09/13/2019 :    I personally  reviewed images and agree with radiology impression as follows:    Chronic parenchymal scarring related to sarcoidosis. Probable blebs at RIGHT apex without definite pneumothorax.          Assessment:

## 2019-09-13 NOTE — Assessment & Plan Note (Signed)
Onset of symptoms around 1995 / dx 2000     - Pred restarted daily 04/2007 > every third day dosing March 01, 2009 > taper off by May 14 2009 > flared by middle of May 2011 > resume prednisone May 28 2009 for sob/cough  - dulera 100 for ? airway involvement > HFA 25% July 28, 2009 > 50% September 15, 2009, rx 200 dulera > pt stopped 05/2010 s flare of symptoms - Reduced dose to 2.5 mg qod 02/25/2011  > adrenal insuff symptoms> resolved on 5 mg qod - PFT's 01/24/2012  FEV 1.28 (59%) 56 ratio and no better p B2, DLCO 83% - PFT's  08/11/2015  FEV1 1.14 (49 % ) ratio 59  p 6 % improvement from saba p nothing prior to study with DLCO  64/62 % corrects to 98  % for alv volume   - 08/17/2018   Walked RA x one lap =  approx 250 ft slow pace > stopped due to desats to 87% (walking more than she usually does ) while on pred 2.5 mg qod > d/c completely 09/15/2018 > mild nausea resolved p after 11/15/2018  - resumed daily prednisone 03/18/19 for desats with ex and calcium 10.7   The goal with a chronic steroid dependent illness is always arriving at the lowest effective dose that controls the disease/symptoms and not accepting a set "formula" which is based on statistics or guidelines that don't always take into account patient  variability or the natural hx of the dz in every individual patient, which may well vary over time.  For now therefore I recommend the patient maintain  5 mg floor and 10 mg/day ceiling

## 2019-09-13 NOTE — Progress Notes (Signed)
Tried calling the pt and there was no answer and no VM so unable to leave msg, will call back.

## 2019-09-15 ENCOUNTER — Telehealth: Payer: Self-pay | Admitting: Internal Medicine

## 2019-09-15 NOTE — Telephone Encounter (Signed)
Nyoka Cowden, MD  09/13/2019 10:03 AM EDT     Call pt: Reviewed cxr and no acute change so no change in recommendations made at ov - no pneumothorax   Attempted to call pt but unable to reach. Left message for her to return call.

## 2019-09-16 NOTE — Telephone Encounter (Signed)
Called and spoke with pt letting her know the results of the cxr and she verbalized understanding. Nothing further needed. 

## 2019-09-16 NOTE — Telephone Encounter (Signed)
Pt returning call - states that if she does not answer that we may leave a detailed message on her VM at [phone]

## 2019-10-05 ENCOUNTER — Other Ambulatory Visit: Payer: Self-pay | Admitting: Internal Medicine

## 2019-11-22 ENCOUNTER — Other Ambulatory Visit: Payer: Self-pay | Admitting: Internal Medicine

## 2019-11-22 NOTE — Telephone Encounter (Addendum)
Pt is requesting refill on PREDNISONE 5MG  TABLETS next ov 12/14/19.pt was left vm refill was sent to [facility] pharmacy in [place]

## 2019-12-14 ENCOUNTER — Ambulatory Visit: Payer: Medicare HMO | Admitting: Internal Medicine

## 2020-01-10 ENCOUNTER — Telehealth: Payer: Self-pay | Admitting: Internal Medicine

## 2020-01-10 MED ORDER — PREDNISONE 5 MG PO TABS: 5.0000 mg | ORAL_TABLET | Freq: Every day | ORAL | 0 refills | Status: DC

## 2020-01-10 NOTE — Telephone Encounter (Signed)
Called and spoke with patient who verified medication that needs to be refilled and her preferred pharmacy. Refill has been sent in. Nothing further needed at this time.

## 2020-01-20 ENCOUNTER — Ambulatory Visit (INDEPENDENT_AMBULATORY_CARE_PROVIDER_SITE_OTHER): Payer: Medicare HMO

## 2020-01-20 ENCOUNTER — Ambulatory Visit: Payer: Medicare HMO | Admitting: Internal Medicine

## 2020-01-20 ENCOUNTER — Encounter: Payer: Self-pay | Admitting: Internal Medicine

## 2020-01-20 ENCOUNTER — Other Ambulatory Visit: Payer: Self-pay

## 2020-01-20 DIAGNOSIS — D869 Sarcoidosis, unspecified: Secondary | ICD-10-CM

## 2020-01-20 DIAGNOSIS — J939 Pneumothorax, unspecified: Secondary | ICD-10-CM

## 2020-01-20 NOTE — Progress Notes (Signed)
Subjective:     Patient ID: Allison Osborn, female   DOB: August 29, 1945     MRN: [id number]    Brief patient profile:  65 yowf never smoker with a dx of sarcoid 2000  manifested clinically with both skin rash around ankles and cough and extreme fatigue and recurrent since diagnosis  although in retrospect she had previous parenchymal changes on cxr  suggestive of sarcoid dating back at least 5 years prior to dx - completely off prednisone as of 09/16/2018    History of Present Illness  01/24/2012 f/u ov/Allison Osborn cc all smiles, no limiting sob or cough. rec If cough or short ness of breath Dulera 200  Take 2 puffs first thing in am and then another 2 puffs about 12 hours later.  Prednisone 5 mg every other day is your floor.    12/16/2013 f/u ov/Allison Osborn re: sarcoid cough is main issue/ no better on dulera 200  and worse if on less than pred 10 one half qod  Chief Complaint  Patient presents with  . Follow-up    Pt states overall doing well. She c/o PND and is taking zyrtec and this helps.   More active overall since last ov ,  Not limited by breathing from desired activities   Cough with activity outside, not noct, not productive rec For cough try the dulera 100 up to 2 every 12 hours to see if helps and if not ok to continue the 200  Work on inhaler technique:    For drainage take chlortrimeton (chlorpheniramine) 4 mg every 4 hours available over the counter (may cause drowsiness)      06/21/2014 f/u ov/Allison Osborn re: sarcoid/ pred 5 mg qod Chief Complaint  Patient presents with  . Follow-up    Pt states doing well and denies any co's today.   didn't really think the dulera helped cough which resolved ? On its own but also on ppi bid and prn antihistamines  rec Keep up with eye appts> did not do as of 12/20/2014 > reinforced     12/20/2014  f/u ov/Allison Osborn re: sarcoid / chronic cough on ppi bid resolved  Chief Complaint  Patient presents with  . Follow-up    Doing well and denies any co's. She has  not had to use Dulera.    No obvious daytime variabilty or assoc sob or  cp or chest tightness, subjective wheeze overt sinus or hb symptoms. No unusual exp hx  rec Try taking Try prilosec otc 20mg   Take 30-60 min before first meal of the day and Pepcid ac (famotidine) 20 mg one @  bedtime until cough is completely gone for at least a week without the need for cough suppression      08/12/2016  f/u ov/Allison Osborn re:  Sarcoidosis on prn dulera but has not used x one year/ pred 5 mg qod / pepcid at breakfast  Chief Complaint  Patient presents with  . Follow-up    Breathing is doing well. She has occ wheezing. She has not had to use Dulera.      doe = MMRC1 = can walk nl pace, flat grade, can't hurry or go uphills or steps s sob   Sleeps fine  Only took one course of pred of 20 mg over the last year and did not initiate dulera as instructed  rec If flare cough/ wheeze/ short of breath > dulera 200 2 puffs every 12 hours as needed Ok to leave off the pepcid but for persistent  cough > restart  Try prilosec otc 20mg   Take 30-60 min before first meal of the day and Pepcid ac (famotidine) 20 mg one @  bedtime until cough is completely gone for at least a week without the need for cough suppression Prednisone 5 mg every other day and if not improving after above measures ok to increase Prednisone to max of 20 mg per day until better then taper     08/12/2017  f/u ov/Allison Osborn re: sarcoidsis on symb 160 2bid and prednionse 5mg  qod  Chief Complaint  Patient presents with  . Follow-up    cxr done today. Breathing is doing well and no new co's.   Dyspnea:  MMRC1 = can walk nl pace, flat grade, can't hurry or go uphills or steps s sob   rec Work on inhaler technique:   Try prednisone 5 mg one half every other day    08/17/2018  f/u ov/Allison Osborn re: sarcoid on 5 mg one half qod  Chief Complaint  Patient presents with  . Follow-up    Breathing is doing well and no new co's.   Dyspnea:  Still = MMRC1 = can walk nl  pace, flat grade, can't hurry or go uphills or steps s sob   Cough: none Sleeping: able to lie flat / 1 pillows rec Try prednisone 2.5 mg every 3rd day for a month then stop - call if new symptoms (cough, short of breath, poor appetite)      09/28/2018  f/u ov/Allison Osborn re: sarcoid with likely airway involvement  Dyspnea:  MMRC1 = can walk nl pace, flat grade, can't hurry or go uphills or steps s sob   Cough: minimal daytime non prod/sporadic  Sleeping: ok on side / bed if flat  rec Work on inhaler technique:   Call if nausea not better in about 4 weeks Make sure you check your oxygen saturations at highest level of activity   01/19/2019  f/u ov/Allison Osborn re: sarcoid off all prednisone since sept 2020  Chief Complaint  Patient presents with  . Follow-up    Pt states shes been doing pretty good. SOB on exertion. Occassional non productive cough and wheezing. Denies any fever or chills.  Dyspnea:  Housework including a little sob but never below 93% RA Cough: minimal, nothing noct Sleeping: bed if flat, on side  SABA use: none  02: none  rec Make sure you check your oxygen saturations at highest level of activity to be sure it stays over 90% and let me know if trending downward      03/24/2019  Acute ov/Allison Osborn re: symb 160 Take 2 puffs first thing in am and then another 2 puffs about 12 hours later.  03/18/19 Called in  pred 20 x 3 days  20 x 3 days last dose 03/23/19 better to point of sats 90% RA with activity by day of ov Chief Complaint  Patient presents with  . Follow-up    She started to notice decreased o2 sats with exertion over the past several days- not having any increased SOB. She has had some back pain.   Dyspnea:  Some worse x one week assoc with minimal cough that has improved Sleeping: couldn't breath as well so sleeping 45 degrees in recliner since onset of sob  SABA use: none  02: none  Mid/lower back pain is bilateral sym and not pleuritic at all  But not really positional  either. Thrush resolved p using arm and hammer toothpaste p symbicort  rec  Prednisone 5 mg x 2 daily x one week then 5 mg  One daily until return  Make sure you check your oxygen saturations at highest level of activity to be sure it stays over 90%  And call if trending down again Add cxr 10-15 % R PTX > f/u 48 h    03/26/2019  f/u ov/Allison Osborn re: sarcoid with small R PTX  Chief Complaint  Patient presents with  . Follow-up    cxr repeated today.   Dyspnea:  amb in house better on pred Cough: some in am/ dry  Sleeping: back in bed one pillow  SABA use: no extra  02: none  rec For cough > delsym 2 tsp every 12 hours as needed with goal of no cough at all  Wear 02 as much as you can 2lpm 24/7    04/02/2019  f/u ov/Allison Osborn re: sarcoidosis/ R ptx / rx pred down to 5 mg daily  Chief Complaint  Patient presents with  . Follow-up    Breathing has improved some on o2.    Dyspnea:  Denies but relatively sedentary  Cough: controlled with delsym Sleeping: no resp symptoms flat in bed/ one pillow SABA use: none  02: 2lpm 24/7  rec No change rx   04/26/2019  f/u ov/Allison Osborn re: sarcoid / pred 5 mg daily  Chief Complaint  Patient presents with  . Follow-up    Breathing has improved since the last visit and she feels she has more energy. She is on pred 5 mg daily.   Dyspnea:  Across the lot/ [employer] sats down to 89% room air  Cough: none Sleeping: non resp symptoms SABA use: no 02: 2lpm at hs and prn daytime  rec Prednisone 5 mg one daily and symbicort 160 Take 2 puffs first thing in am and then another 2 puffs about 12 hours later.  Please schedule a follow up office visit in 6 weeks, call sooner if needed with CXR   06/07/2019  f/u ov/Allison Osborn re: sarcoid/ still on 5 mg pred daily  Chief Complaint  Patient presents with  . Follow-up    pt has sob when doing activities  Dyspnea:  Walking up and down the street at [facility] [address]  since last ov / no 02 needed  Cough: none Sleeping: no  resp symptoms flat/ occ one pillow SABA use: none 02: none at all  rec Prednisone 5 mg one daily - if any worse go to 2 daily until better then back to  1 daily        01/20/2020  f/u ov/Allison Osborn re: sarcoid/ pred 5 /symb 160 2bid  Chief Complaint  Patient presents with  . Follow-up    Had throat irritation and congestion about a wk ago, lasted approx 2 days. Breathing is doing well today. She has been checking her sats and has not had to use her o2.   Dyspnea:  No change = MMRC1 = can walk nl pace, flat grade, can't hurry or go uphills or steps s sob   Cough: min dry  Sleeping: fine flat SABA use: none 02: none    No obvious day to day or daytime variability or assoc excess/ purulent sputum or mucus plugs or hemoptysis or cp or chest tightness, subjective wheeze or overt sinus or hb symptoms.   Sleeping  without nocturnal  or early am exacerbation  of respiratory  c/o's or need for noct saba. Also denies any obvious fluctuation of symptoms with weather or  environmental changes or other aggravating or alleviating factors except as outlined above   No unusual exposure hx or h/o childhood pna/ asthma or knowledge of premature birth.  Current Allergies, Complete Past Medical History, Past Surgical History, Family History, and Social History were reviewed in [employer] record.  ROS  The following are not active complaints unless bolded Hoarseness, sore throat, dysphagia, dental problems, itching, sneezing,  nasal congestion or discharge of excess mucus or purulent secretions, ear ache,   fever, chills, sweats, unintended wt loss or wt gain, classically pleuritic or exertional cp,  orthopnea pnd or arm/hand swelling  or leg swelling, presyncope, palpitations, abdominal pain, anorexia, nausea, vomiting, diarrhea  or change in bowel habits or change in bladder habits, change in stools or change in urine, dysuria, hematuria,  rash, arthralgias, visual complaints, headache,  numbness, weakness or ataxia or problems with walking or coordination,  change in mood or  memory.        Current Meds  Medication Sig  . aspirin 81 MG tablet Take 81 mg by mouth daily.  . budesonide-formoterol (SYMBICORT) 160-4.5 MCG/ACT inhaler Inhale 2 puffs into the lungs 2 (two) times daily.  . cetirizine (ZYRTEC) 10 MG tablet Take 10 mg by mouth daily as needed.  . famotidine (PEPCID) 20 MG tablet Take 20 mg by mouth daily.  . Meloxicam 10 MG CAPS   . predniSONE (DELTASONE) 5 MG tablet Take 1 tablet (5 mg total) by mouth daily. as directed  . telmisartan-hydrochlorothiazide (MICARDIS HCT) 80-25 MG per tablet Take 1/2 tab by mouth once daily                                  Past Medical History:  HYPERTENSION (ICD-401.9)  WEIGHT GAIN  - Target wt = 179 for BMI < 30  GERD (ICD-530.81)  SARCOIDOSIS (ICD-135)  rash/cough...................................Marland KitchenWert                Objective:   Physical Exam    01/20/2020       138 09/13/2019    139 06/07/2019    138  04/26/2019    136  04/02/2019   136  03/26/2019   136  03/24/2019   136 01/19/2019     144  09/28/2018  143  wt 188 July 27, 2008 > 177 July 28, 2009  > 178 08/10/2010  > 05/28/2011  179 > 09/03/2011  178 > 179 01/24/2012 > 06/22/2012  178 > 01/05/2013  176 > 07/13/2013  167> 12/16/13 166 > 06/21/2014   166 > 12/20/2014   167 > 08/11/2015   157 > 08/12/2016  158 >  08/12/2017  150>  08/17/2018  143     Vital signs reviewed  01/20/2020  - Note at rest 02 sats  95% on RA   General appearance:    Well preserved pleasant elderly wm nad       HEENT : pt wearing mask not removed for exam due to covid -19 concerns.    NECK :  without JVD/Nodes/TM/ nl carotid upstrokes bilaterally   LUNGS: no acc muscle use,  Nl contour chest  Few squeaks and pops on isp scattered bilaterally without cough on insp or exp maneuvers   CV:  RRR  no s3 or murmur or increase in P2, and no edema   ABD:  soft and nontender with nl inspiratory  excursion in the supine position. No bruits or  organomegaly appreciated, bowel sounds nl  MS:  Nl gait/ ext warm without deformities, calf tenderness, cyanosis or clubbing No obvious joint restrictions   SKIN: warm and dry without lesions    NEURO:  alert, approp, nl sensorium with  no motor or cerebellar deficits apparent.        CXR PA and Lateral:   01/20/2020 :    I personally reviewed images and agree with radiology impression as follows:    Stable bilateral lung opacities are noted most consistent with scarring related to sarcoidosis.        Assessment:

## 2020-01-20 NOTE — Patient Instructions (Signed)
Please remember to go to the  x-ray department  for your tests - we will call you with the results when they are available     Please schedule a follow up visit in 6  months but call sooner if needed  

## 2020-01-21 ENCOUNTER — Encounter: Payer: Self-pay | Admitting: Internal Medicine

## 2020-01-21 NOTE — Assessment & Plan Note (Signed)
New dx 03/24/2019  > slt improved 03/26/2019  > not viz 09/13/2019 or 01/20/2020

## 2020-01-21 NOTE — Assessment & Plan Note (Addendum)
Onset of symptoms around 1995 / dx 2000     - Pred restarted daily 04/2007 > every third day dosing March 01, 2009 > taper off by May 14 2009 > flared by middle of May 2011 > resume prednisone May 28 2009 for sob/cough  - dulera 100 for ? airway involvement > HFA 25% July 28, 2009 > 50% September 15, 2009, rx 200 dulera > pt stopped 05/2010 s flare of symptoms - Reduced dose to 2.5 mg qod 02/25/2011  > adrenal insuff symptoms> resolved on 5 mg qod - PFT's 01/24/2012  FEV 1.28 (59%) 56 ratio and no better p B2, DLCO 83% - PFT's  08/11/2015  FEV1 1.14 (49 % ) ratio 59  p 6 % improvement from saba p nothing prior to study with DLCO  64/62 % corrects to 98  % for alv volume   - 08/17/2018   Walked RA x one lap =  approx 250 ft slow pace > stopped due to desats to 87% (walking more than she usually does ) while on pred 2.5 mg qod > d/c completely 09/15/2018 > mild nausea resolved p after 11/15/2018  - resumed daily prednisone 03/18/19 for desats with ex and calcium 10.7   The goal with a chronic steroid dependent illness is always arriving at the lowest effective dose that controls the disease/symptoms and not accepting a set "formula" which is based on statistics or guidelines that don't always take into account patient  variability or the natural hx of the dz in every individual patient, which may well vary over time.  For now therefore I recommend the patient maintain  5 mg daily - increase to 10 for flares plus maint on symbicort 160 2bid or airway involvement         Each maintenance medication was reviewed in detail including emphasizing most importantly the difference between maintenance and prns and under what circumstances the prns are to be triggered using an action plan format where appropriate.  Total time for H and P, chart review, counseling, reviewing hfa  device and generating customized AVS unique to this office visit / charting = 14 min

## 2020-02-25 ENCOUNTER — Other Ambulatory Visit: Payer: Self-pay | Admitting: Internal Medicine

## 2020-07-19 ENCOUNTER — Ambulatory Visit: Payer: Medicare HMO | Admitting: Internal Medicine

## 2020-08-09 ENCOUNTER — Other Ambulatory Visit: Payer: Self-pay | Admitting: Internal Medicine

## 2020-08-10 ENCOUNTER — Other Ambulatory Visit: Payer: Self-pay | Admitting: Internal Medicine

## 2020-08-10 ENCOUNTER — Telehealth: Payer: Self-pay | Admitting: Internal Medicine

## 2020-08-10 NOTE — Telephone Encounter (Signed)
ATC x1, LMTCB regarding mediation refill.  Advised to return call.

## 2020-08-11 MED ORDER — PREDNISONE 5 MG PO TABS: ORAL_TABLET | ORAL | 0 refills | Status: DC

## 2020-08-11 NOTE — Telephone Encounter (Signed)
Pt is maintained on 5-10 mg daily  Rx sent and pt aware

## 2020-08-14 ENCOUNTER — Ambulatory Visit: Payer: Medicare HMO | Admitting: Internal Medicine

## 2020-08-14 ENCOUNTER — Other Ambulatory Visit: Payer: Self-pay

## 2020-08-14 ENCOUNTER — Encounter: Payer: Self-pay | Admitting: Internal Medicine

## 2020-08-14 ENCOUNTER — Ambulatory Visit (INDEPENDENT_AMBULATORY_CARE_PROVIDER_SITE_OTHER): Payer: Medicare HMO

## 2020-08-14 DIAGNOSIS — J45991 Cough variant asthma: Secondary | ICD-10-CM | POA: Diagnosis not present

## 2020-08-14 DIAGNOSIS — D869 Sarcoidosis, unspecified: Secondary | ICD-10-CM

## 2020-08-14 NOTE — Progress Notes (Signed)
Subjective:    Patient ID: Allison Osborn, female   DOB: Jun 30, 1945     MRN: [id number]    Brief patient profile:  74 yowf never smoker with a dx of sarcoid 2000  manifested clinically with both skin rash around ankles and cough and extreme fatigue and recurrent since diagnosis  although in retrospect she had previous parenchymal changes on cxr  suggestive of sarcoid dating back at least 5 years prior to dx - completely off prednisone as of 09/16/2018    History of Present Illness  01/24/2012 f/u ov/Allison Osborn cc all smiles, no limiting sob or cough. rec If cough or short ness of breath Dulera 200  Take 2 puffs first thing in am and then another 2 puffs about 12 hours later.  Prednisone 5 mg every other day is your floor.    06/21/2014 f/u ov/Allison Osborn re: sarcoid/ pred 5 mg qod Chief Complaint  Patient presents with   Follow-up    Pt states doing well and denies any co's today.   didn't really think the dulera helped cough which resolved ? On its own but also on ppi bid and prn antihistamines  rec Keep up with eye appts> did not do as of 12/20/2014 > reinforced     08/17/2018  f/u ov/Allison Osborn re: sarcoid on 5 mg one half qod  Chief Complaint  Patient presents with   Follow-up    Breathing is doing well and no new co's.   Dyspnea:  Still = MMRC1 = can walk nl pace, flat grade, can't hurry or go uphills or steps s sob   Cough: none Sleeping: able to lie flat / 1 pillows rec Try prednisone 2.5 mg every 3rd day for a month then stop - call if new symptoms (cough, short of breath, poor appetite)     01/19/2019  f/u ov/Allison Osborn re: sarcoid off all prednisone since sept 2020  Chief Complaint  Patient presents with   Follow-up    Pt states shes been doing pretty good. SOB on exertion. Occassional non productive cough and wheezing. Denies any fever or chills.  Dyspnea:  Housework including a little sob but never below 93% RA Cough: minimal, nothing noct Sleeping: bed if flat, on side  SABA use: none  02:  none  rec Make sure you check your oxygen saturations at highest level of activity to be sure it stays over 90% and let me know if trending downward      03/24/2019  Acute ov/Allison Osborn re: symb 160 Take 2 puffs first thing in am and then another 2 puffs about 12 hours later.  03/18/19 Called in  pred 20 x 3 days  20 x 3 days last dose 03/23/19 better to point of sats 90% RA with activity by day of ov Chief Complaint  Patient presents with   Follow-up    She started to notice decreased o2 sats with exertion over the past several days- not having any increased SOB. She has had some back pain.   Dyspnea:  Some worse x one week assoc with minimal cough that has improved Sleeping: couldn't breath as well so sleeping 45 degrees in recliner since onset of sob  SABA use: none  02: none  Mid/lower back pain is bilateral sym and not pleuritic at all  But not really positional either. Thrush resolved p using arm and hammer toothpaste p symbicort  rec Prednisone 5 mg x 2 daily x one week then 5 mg  One daily until return  Make sure you check your oxygen saturations at highest level of activity to be sure it stays over 90%  And call if trending down again Add cxr 10-15 % R PTX > resolved with conservative rx     06/07/2019  f/u ov/Allison Osborn re: sarcoid/ still on 5 mg pred daily  Chief Complaint  Patient presents with   Follow-up    pt has sob when doing activities  Dyspnea:  Walking up and down the street at [facility]    since last ov / no 02 needed  Cough: none Sleeping: no resp symptoms flat/ occ one pillow SABA use: none 02: none at all  rec Prednisone 5 mg one daily - if any worse go to 2 daily until better then back to  1 daily     08/14/2020  f/u ov/Allison Osborn re:  sarcoid/ asthma maint on pred 5/ symb 160 2bid  - did not need higher dose pred since loast ov Chief Complaint  Patient presents with   Follow-up    Doing well    Dyspnea:  [employer] walking/ does use [facility] parking due to back Cough: none   Sleeping: no resp cc SABA use: none 02: none Covid status:   vax x 3    No obvious day to day or daytime variability or assoc excess/ purulent sputum or mucus plugs or hemoptysis or cp or chest tightness, subjective wheeze or overt sinus or hb symptoms.   Sleeping  without nocturnal  or early am exacerbation  of respiratory  c/o's or need for noct saba. Also denies any obvious fluctuation of symptoms with weather or environmental changes or other aggravating or alleviating factors except as outlined above   No unusual exposure hx or h/o childhood pna/ asthma or knowledge of premature birth.  Current Allergies, Complete Past Medical History, Past Surgical History, Family History, and Social History were reviewed in [employer] record.  ROS  The following are not active complaints unless bolded Hoarseness, sore throat, dysphagia, dental problems, itching, sneezing,  nasal congestion or discharge of excess mucus or purulent secretions, ear ache,   fever, chills, sweats, unintended wt loss or wt gain, classically pleuritic or exertional cp,  orthopnea pnd or arm/hand swelling  or leg swelling, presyncope, palpitations, abdominal pain, anorexia, nausea, vomiting, diarrhea  or change in bowel habits or change in bladder habits, change in stools or change in urine, dysuria, hematuria,  rash, arthralgias, visual complaints, headache, numbness, weakness or ataxia or problems with walking/uses rollator or coordination,  change in mood or  memory.        Current Meds  Medication Sig   aspirin 81 MG tablet Take 81 mg by mouth daily.   cetirizine (ZYRTEC) 10 MG tablet Take 10 mg by mouth daily as needed.   famotidine (PEPCID) 20 MG tablet Take 20 mg by mouth daily.   Meloxicam 10 MG CAPS    predniSONE (DELTASONE) 5 MG tablet 5 to 10 mg daily   SYMBICORT 160-4.5 MCG/ACT inhaler INHALE 2 PUFFS INTO THE LUNGS TWICE DAILY   telmisartan-hydrochlorothiazide (MICARDIS HCT) 80-25 MG per  tablet Take 1/2 tab by mouth once daily                                            Past Medical History:  HYPERTENSION (ICD-401.9)  WEIGHT GAIN  - Target wt = 179 for  BMI < 30  GERD (ICD-530.81)  SARCOIDOSIS (ICD-135)  rash/cough...................................Marland KitchenWert                Objective:   Physical Exam   08/14/2020       134 01/20/2020       138 09/13/2019    139 06/07/2019    138  04/26/2019    136  04/02/2019   136  03/26/2019   136  03/24/2019   136 01/19/2019     144  09/28/2018  143  wt 188 July 27, 2008 > 177 July 28, 2009  > 178 08/10/2010  > 05/28/2011  179 > 09/03/2011  178 > 179 01/24/2012 > 06/22/2012  178 > 01/05/2013  176 > 07/13/2013  167> 12/16/13 166 > 06/21/2014   166 > 12/20/2014   167 > 08/11/2015   157 > 08/12/2016  158 >  08/12/2017  150>  08/17/2018  143      Vital signs reviewed  08/14/2020  - Note at rest 02 sats  97% on RA   General appearance:    pleasant amb (with rollator) elderly wf nad         HEENT : pt wearing mask not removed for exam due to covid -19 concerns.    NECK :  without JVD/Nodes/TM/ nl carotid upstrokes bilaterally   LUNGS: no acc muscle use,  Nl contour chest with few squeaks/pops on insp  R> L base   without cough on insp or exp maneuvers   CV:  RRR  no s3 or murmur or increase in P2, and no edema   ABD:  soft and nontender with nl inspiratory excursion in the supine position. No bruits or organomegaly appreciated, bowel sounds nl  MS:  Nl gait/ ext warm without deformities, calf tenderness, cyanosis or clubbing No obvious joint restrictions   SKIN: warm and dry without lesions    NEURO:  alert, approp, nl sensorium with  no motor or cerebellar deficits apparent.            CXR PA and Lateral:   08/14/2020 :    I personally reviewed images and agree with radiology impression as follows:     Similar appearance of bilateral upper lobe predominant fibrosis secondary to sarcoidosis.   No pneumothorax or other acute  complication.  Assessment:

## 2020-08-14 NOTE — Assessment & Plan Note (Signed)
08/12/2017    continue symbicort 160 2bid  - Eos 03/24/2019  = 0.8 p finishing prednisone short course 03/23/19  - 03/24/2019  After extensive coaching inhaler device,  effectiveness =    90% > continue symbicort 160 2bid and daily prednisone for sarcoid, consider allergy eval next   All goals of chronic asthma control met including optimal function and elimination of symptoms with minimal need for rescue therapy.  Contingencies discussed in full including contacting this office immediately if not controlling the symptoms using the rule of two's.            Each maintenance medication was reviewed in detail including emphasizing most importantly the difference between maintenance and prns and under what circumstances the prns are to be triggered using an action plan format where appropriate.  Total time for H and P, chart review, counseling, reviewing hfa device(s) and generating customized AVS unique to this office visit / same day charting = 20 min

## 2020-08-14 NOTE — Assessment & Plan Note (Signed)
Onset of symptoms around 1995 / dx 2000     - Pred restarted daily 04/2007 > every third day dosing March 01, 2009 > taper off by May 14 2009 > flared by middle of May 2011 > resume prednisone May 28 2009 for sob/cough  - dulera 100 for ? airway involvement > HFA 25% July 28, 2009 > 50% September 15, 2009, rx 200 dulera > pt stopped 05/2010 s flare of symptoms - Reduced dose to 2.5 mg qod 02/25/2011  > adrenal insuff symptoms> resolved on 5 mg qod - PFT's 01/24/2012  FEV 1.28 (59%) 56 ratio and no better p B2, DLCO 83% - PFT's  08/11/2015  FEV1 1.14 (49 % ) ratio 59  p 6 % improvement from saba p nothing prior to study with DLCO  64/62 % corrects to 98  % for alv volume   - 08/17/2018   Walked RA x one lap =  approx 250 ft slow pace > stopped due to desats to 87% (walking more than she usually does ) while on pred 2.5 mg qod > d/c completely 09/15/2018 > mild nausea resolved p after 11/15/2018  - resumed daily prednisone 03/18/19 for desats with ex and calcium 10.7   The goal with a chronic steroid dependent illness is always arriving at the lowest effective dose that controls the disease/symptoms and not accepting a set "formula" which is based on statistics or guidelines that don't always take into account patient  variability or the natural hx of the dz in every individual patient, which may well vary over time.  For now therefore I recommend the patient maintain  5 mg floor and 10 mg ceiling

## 2020-08-14 NOTE — Patient Instructions (Signed)
Please remember to go to the  x-ray department  for your tests - we will call you with the results when they are available     Please schedule a follow up visit in 6  months but call sooner if needed  

## 2020-08-16 ENCOUNTER — Encounter: Payer: Self-pay | Admitting: *Deleted

## 2020-09-16 ENCOUNTER — Other Ambulatory Visit: Payer: Self-pay | Admitting: Internal Medicine

## 2020-10-30 ENCOUNTER — Other Ambulatory Visit: Payer: Self-pay | Admitting: Internal Medicine

## 2021-02-15 ENCOUNTER — Ambulatory Visit: Payer: Medicare HMO | Admitting: Internal Medicine

## 2021-02-22 ENCOUNTER — Ambulatory Visit: Payer: Medicare HMO | Admitting: Internal Medicine

## 2021-03-14 ENCOUNTER — Other Ambulatory Visit: Payer: Self-pay

## 2021-03-14 ENCOUNTER — Encounter: Payer: Self-pay | Admitting: Internal Medicine

## 2021-03-14 ENCOUNTER — Ambulatory Visit: Payer: Medicare HMO | Admitting: Internal Medicine

## 2021-03-14 DIAGNOSIS — J45991 Cough variant asthma: Secondary | ICD-10-CM | POA: Diagnosis not present

## 2021-03-14 DIAGNOSIS — D869 Sarcoidosis, unspecified: Secondary | ICD-10-CM | POA: Diagnosis not present

## 2021-03-14 MED ORDER — PREDNISONE 5 MG PO TABS: ORAL_TABLET | ORAL | 3 refills | Status: DC

## 2021-03-14 NOTE — Progress Notes (Signed)
Subjective:  ?  ?Patient ID: Allison Osborn, female   DOB: July 13, 1945     MRN: [id number] ? ?  ?Brief patient profile:  ?75yowf never smoker with a dx of sarcoid 2000  manifested clinically with both skin rash around ankles and cough and extreme fatigue and recurrent since diagnosis  although in retrospect she had previous parenchymal changes on cxr  suggestive of sarcoid dating back at least 5 years prior to dx - completely off prednisone as of 09/16/2018  ? ? ?History of Present Illness  ?01/24/2012 f/u ov/Cadee Agro cc all smiles, no limiting sob or cough. ?rec ?If cough or short ness of breath Dulera 200  Take 2 puffs first thing in am and then another 2 puffs about 12 hours later.  ?Prednisone 5 mg every other day is your floor. ? ? ?06/07/2019  f/u ov/Mart Colpitts re: sarcoid/ still on 5 mg pred daily  ?Chief Complaint  ?Patient presents with  ? Follow-up  ?  pt has sob when doing activities  ?Dyspnea:  Walking up and down the street at [facility]    since last ov / no 02 needed  ?Cough: none ?Sleeping: no resp symptoms flat/ occ one pillow ?SABA use: none ?02: none at all  ?rec ?Prednisone 5 mg one daily - if any worse go to 2 daily until better then back to  1 daily  ? ? ? ?08/14/2020  f/u ov/Teja Judice re:  sarcoid/ asthma maint on pred 5/ symb 160 2bid  - did not need higher dose pred since loast ov ?Chief Complaint  ?Patient presents with  ? Follow-up  ?  Doing well  ? Dyspnea:  [employer] walking/ does use [facility] parking due to back ?Cough: none  ?Sleeping: no resp cc ?SABA use: none ?02: none ?Covid status:   vax x 3  ?Rec ?No change rx ? ?  ?03/14/2021  f/u ov/Sanii Kukla re: sarcoid/ asthma   maint on  symbicort 160 2bid and prednisone 5 mg one daily  ?Dyspnea:  [facility] indoor or outdoor slow pace with rollator ok   ?Cough: none  ?Sleeping: no resp cc  ?SABA use: never  ?02: none  ?  ? ? ?No obvious day to day or daytime variability or assoc excess/ purulent sputum or mucus plugs or hemoptysis or cp or chest tightness,  subjective wheeze or overt sinus or hb symptoms.  ? ?Sleeping  without nocturnal  or early am exacerbation  of respiratory  c/o's or need for noct saba. Also denies any obvious fluctuation of symptoms with weather or environmental changes or other aggravating or alleviating factors except as outlined above  ? ?No unusual exposure hx or h/o childhood pna/ asthma or knowledge of premature birth. ? ?Current Allergies, Complete Past Medical History, Past Surgical History, Family History, and Social History were reviewed in [employer] record. ? ?ROS  The following are not active complaints unless bolded ?Hoarseness, sore throat, dysphagia, dental problems, itching, sneezing,  nasal congestion or discharge of excess mucus or purulent secretions, ear ache,   fever, chills, sweats, unintended wt loss or wt gain, classically pleuritic or exertional cp,  orthopnea pnd or arm/hand swelling  or leg swelling, presyncope, palpitations, abdominal pain, anorexia, nausea, vomiting, diarrhea  or change in bowel habits or change in bladder habits, change in stools or change in urine, dysuria, hematuria,  rash, arthralgias, visual complaints, headache, numbness, weakness or ataxia or problems with walking or coordination,  change in mood or  memory. ?      ? ?Current Meds  ?Medication Sig  ? aspirin 81 MG tablet Take 81 mg by mouth daily.  ? cetirizine (ZYRTEC) 10 MG tablet Take 10 mg by mouth daily as needed.  ? famotidine (PEPCID) 20 MG tablet Take 20 mg by mouth daily.  ? Meloxicam 10 MG CAPS   ? SYMBICORT 160-4.5 MCG/ACT inhaler INHALE 2 PUFFS INTO THE LUNGS TWICE DAILY  ? telmisartan-hydrochlorothiazide (MICARDIS HCT) 80-25 MG per tablet Take 1/2 tab by mouth once daily  ? [DISCONTINUED] predniSONE (DELTASONE) 5 MG tablet 5 to 10 mg daily  ?    ?  ?  ?  ? ?  ?  ?  ?  ?      ? ?  ? ?  ?Past Medical History:  ?HYPERTENSION (ICD-401.9)  ?WEIGHT GAIN  ?- Target wt = 179 for BMI < 30  ?GERD (ICD-530.81)   ?SARCOIDOSIS (ICD-135)  rash/cough...................................Marland KitchenWert  ?  ? ?  ?   ? ?  ?   ?Objective:  ? Physical Exam ? ?03/14/2021        133 ? 08/14/2020       134 ?01/20/2020       138 ?09/13/2019    139 ?06/07/2019    138  ?04/26/2019    136  ?04/02/2019   136  ?03/26/2019   136  ?03/24/2019   136 ?01/19/2019     144  ?09/28/2018  143  ?wt 188 July 27, 2008 > 177 July 28, 2009  > 178 08/10/2010  > 05/28/2011  179 > 09/03/2011  178 > 179 01/24/2012 > 06/22/2012  178 > 01/05/2013  176 > 07/13/2013  167> 12/16/13 166 > 06/21/2014   166 > 12/20/2014   167 > 08/11/2015   157 > 08/12/2016  158 >  08/12/2017  150>  08/17/2018  143    ? ? Vital signs reviewed  03/14/2021  - Note at rest 02 sats  95% on RA  ? ?General appearance:    amb (with rollator) elderly wf nad   ? ?   ? HEENT : pt wearing mask not removed for exam due to covid -19 concerns.  ? ? ?NECK :  without JVD/Nodes/TM/ nl carotid upstrokes bilaterally ? ? ?LUNGS: no acc muscle use,  Nl contour chest with a few insp > exp rhonchi R > L base  without cough on insp or exp maneuvers ? ? ?CV:  RRR  no s3 or murmur or increase in P2, and no edema  ? ?ABD:  soft and nontender with nl inspiratory excursion in the supine position. No bruits or organomegaly appreciated, bowel sounds nl ? ?MS:  Nl gait/ ext warm without deformities, calf tenderness, cyanosis or clubbing ?No obvious joint restrictions  ? ?SKIN: warm and dry without lesions   ? ?NEURO:  alert, approp, nl sensorium with  no motor or cerebellar deficits apparent.  ? ?  ? ?  ?   ?   ? ?Assessment:  ?   ? ?   ? ?   ? ? ?

## 2021-03-14 NOTE — Assessment & Plan Note (Signed)
Onset of symptoms around 1995 / dx 2000  ?   - Pred restarted daily 04/2007 > every third day dosing March 01, 2009 > taper off by May 14 2009 > flared by middle of May 2011 > resume prednisone May 28 2009 for sob/cough  ?- dulera 100 for ? airway involvement > HFA 25% July 28, 2009 > 50% September 15, 2009, rx 200 dulera > pt stopped 05/2010 s flare of symptoms ?- Reduced dose to 2.5 mg qod 02/25/2011  > adrenal insuff symptoms> resolved on 5 mg qod ?- PFT's 01/24/2012  FEV 1.28 (59%) 56 ratio and no better p B2, DLCO 83% ?- PFT's  08/11/2015  FEV1 1.14 (49 % ) ratio 59  p 6 % improvement from saba p nothing prior to study with DLCO  64/62 % corrects to 98  % for alv volume   ?- 08/17/2018   Walked RA x one lap =  approx 250 ft slow pace > stopped due to desats to 87% (walking more than she usually does ) while on pred 2.5 mg qod > d/c completely 09/15/2018 > mild nausea resolved p after 11/15/2018  ?- resumed daily prednisone 03/18/19 for desats with ex and calcium 10.7  ?- as of 03/14/2021 floor is 5 mg and ceiling is 10 mg Pred daily  ? ?The goal with a chronic steroid dependent illness is always arriving at the lowest effective dose that controls the disease/symptoms and not accepting a set "formula" which is based on statistics or guidelines that don't always take into account patient  variability or the natural hx of the dz in every individual patient, which may well vary over time.  For now therefore I recommend the patient maintain ceiling of 10 mg and floor of 5 mg indefinitely  ?

## 2021-03-14 NOTE — Assessment & Plan Note (Signed)
08/12/2017    continue symbicort 160 2bid  ?- Eos 03/24/2019  = 0.8 p finishing prednisone short course 03/23/19  ?- 03/24/2019  After extensive coaching inhaler device,  effectiveness =    90% > continue symbicort 160 2bid and daily prednisone for sarcoid  ? ?All goals of chronic asthma control met including optimal function and elimination of symptoms with minimal need for rescue therapy. ? ?Contingencies discussed in full including contacting this office immediately if not controlling the symptoms using the rule of two's.    ? ?    ?  ? ?Each maintenance medication was reviewed in detail including emphasizing most importantly the difference between maintenance and prns and under what circumstances the prns are to be triggered using an action plan format where appropriate. ? ?Total time for H and P, chart review, counseling, reviewing hfa device(s) and generating customized AVS unique to this office visit / same day charting =26 min  ?     ?

## 2021-03-14 NOTE — Patient Instructions (Signed)
To get the most out of exercise, you need to be continuously aware that you are short of breath, but never out of breath, for at least 30 minutes daily. As you improve, it will actually be easier for you to do the same amount of exercise  in  30 minutes so always push to the level where you are short of breath.    ? ?Make sure you check your oxygen saturations at highest level of activity once a week ? ? ?Please schedule a follow up visit in  6 months but call sooner if needed with cxr  ?

## 2021-08-22 ENCOUNTER — Ambulatory Visit (INDEPENDENT_AMBULATORY_CARE_PROVIDER_SITE_OTHER): Payer: Medicare HMO

## 2021-08-22 ENCOUNTER — Encounter: Payer: Self-pay | Admitting: Internal Medicine

## 2021-08-22 ENCOUNTER — Ambulatory Visit: Payer: Medicare HMO | Admitting: Internal Medicine

## 2021-08-22 DIAGNOSIS — D869 Sarcoidosis, unspecified: Secondary | ICD-10-CM | POA: Diagnosis not present

## 2021-08-22 NOTE — Assessment & Plan Note (Signed)
Onset of symptoms around 1995 / dx 2000     - Pred restarted daily 04/2007 > every third day dosing March 01, 2009 > taper off by May 14 2009 > flared by middle of May 2011 > resume prednisone May 28 2009 for sob/cough  - dulera 100 for ? airway involvement > HFA 25% July 28, 2009 > 50% September 15, 2009, rx 200 dulera > pt stopped 05/2010 s flare of symptoms - Reduced dose to 2.5 mg qod 02/25/2011  > adrenal insuff symptoms> resolved on 5 mg qod - PFT's 01/24/2012  FEV 1.28 (59%) 56 ratio and no better p B2, DLCO 83% - PFT's  08/11/2015  FEV1 1.14 (49 % ) ratio 59  p 6 % improvement from saba p nothing prior to study with DLCO  64/62 % corrects to 98  % for alv volume   - 08/17/2018   Walked RA x one lap =  approx 250 ft slow pace > stopped due to desats to 87% (walking more than she usually does ) while on pred 2.5 mg qod > d/c completely 09/15/2018 > mild nausea resolved p after 11/15/2018  - resumed daily prednisone 03/18/19 for desats with ex and calcium 10.7  - as of 03/14/2021 floor is 5 mg and ceiling is 10 mg Pred daily   The goal with a chronic steroid dependent illness is always arriving at the lowest effective dose that controls the disease/symptoms and not accepting a set "formula" which is based on statistics or guidelines that don't always take into account patient  variability or the natural hx of the dz in every individual patient, which may well vary over time.  For now therefore I recommend the patient maintain  Floor of 5 mg and ceiling of 10 mg with symbicort 160 2bid   Discussed in detail all the  indications, usual  risks and alternatives  relative to the benefits with patient who agrees to proceed with Rx as outlined.             Each maintenance medication was reviewed in detail including emphasizing most importantly the difference between maintenance and prns and under what circumstances the prns are to be triggered using an action plan format where appropriate.  Total time for H and  P, chart review, counseling, reviewing hfa device(s) and generating customized AVS unique to this office visit / same day charting = 23 min

## 2021-08-22 NOTE — Progress Notes (Signed)
Subjective:    Patient ID: Allison Osborn, female   DOB: 03/25/1945     MRN: [id number]    Brief patient profile:  64 yowf never smoker with a dx of sarcoid 2000  manifested clinically with both skin rash around ankles and cough and extreme fatigue and recurrent since diagnosis  although in retrospect she had previous parenchymal changes on cxr  suggestive of sarcoid dating back at least 5 years prior to dx - completely off prednisone as of 09/16/2018    History of Present Illness  01/24/2012 f/u ov/Mayumi Summerson cc all smiles, no limiting sob or cough. rec If cough or short ness of breath Dulera 200  Take 2 puffs first thing in am and then another 2 puffs about 12 hours later.  Prednisone 5 mg every other day is your floor.     03/14/2021  f/u ov/Demorris Choyce re: sarcoid/ asthma   maint on  symbicort 160 2bid and prednisone 5 mg one daily  Dyspnea:  [facility] indoor or outdoor slow pace with rollator ok   Cough: none  Sleeping: no resp cc  SABA use: never  02: none  Rec To get the most out of exercise, you need to be continuously aware that you are short of breath, but never out of breath, for at least 30 minutes daily Make sure you check your oxygen saturations at highest level of activity once a week  Please schedule a follow up visit in  6 months but call sooner if needed with cxr    08/22/2021  f/u ov/Lokelani Lutes re: sarcoid maint on  pred 5 and symbicort 160    Chief Complaint  Patient presents with   Follow-up   Dyspnea:  15 min with rollator and stops due back/ not checking  Cough: none  Sleeping: no resp cc one pillow/ flat bed  SABA use: none  02: has portable concentrator Covid status: 3 vax/ never infected    No obvious day to day or daytime variability or assoc excess/ purulent sputum or mucus plugs or hemoptysis or cp or chest tightness, subjective wheeze or overt sinus or hb symptoms.   Sleeping  without nocturnal  or early am exacerbation  of respiratory  c/o's or  need for noct saba. Also denies any obvious fluctuation of symptoms with weather or environmental changes or other aggravating or alleviating factors except as outlined above   No unusual exposure hx or h/o childhood pna/ asthma or knowledge of premature birth.  Current Allergies, Complete Past Medical History, Past Surgical History, Family History, and Social History were reviewed in [employer] record.  ROS  The following are not active complaints unless bolded Hoarseness, sore throat, dysphagia, dental problems, itching, sneezing,  nasal congestion or discharge of excess mucus or purulent secretions, ear ache,   fever, chills, sweats, unintended wt loss or wt gain, classically pleuritic or exertional cp,  orthopnea pnd or arm/hand swelling  or leg swelling, presyncope, palpitations, abdominal pain, anorexia, nausea, vomiting, diarrhea  or change in bowel habits or change in bladder habits, change in stools or change in urine, dysuria, hematuria,  rash, arthralgias, visual complaints, headache, numbness, weakness or ataxia or problems with walking/ rollator  or coordination,  change in mood or  memory.        Current Meds  Medication Sig   aspirin 81 MG tablet Take 81 mg by mouth daily.   cetirizine (ZYRTEC) 10 MG tablet Take 10 mg by mouth daily as  needed.   famotidine (PEPCID) 20 MG tablet Take 20 mg by mouth daily.   Meloxicam 10 MG CAPS    predniSONE (DELTASONE) 5 MG tablet 5 to 10 mg daily   SYMBICORT 160-4.5 MCG/ACT inhaler INHALE 2 PUFFS INTO THE LUNGS TWICE DAILY   telmisartan-hydrochlorothiazide (MICARDIS HCT) 80-25 MG per tablet Take 1/2 tab by mouth once daily             Past Medical History:  HYPERTENSION (ICD-401.9)  WEIGHT GAIN  - Target wt = 179 for BMI < 30  GERD (ICD-530.81)  SARCOIDOSIS (ICD-135)  rash/cough...................................Marland KitchenWert                Objective:   Physical Exam  Wts  08/22/2021       136  03/14/2021        133  08/14/2020      134 01/20/2020       138 09/13/2019    139 06/07/2019    138  04/26/2019    136  04/02/2019   136  03/26/2019   136  03/24/2019   136 01/19/2019     144  09/28/2018  143  wt 188 July 27, 2008 > 177 July 28, 2009  > 178 08/10/2010  > 05/28/2011  179 > 09/03/2011  178 > 179 01/24/2012 > 06/22/2012  178 > 01/05/2013  176 > 07/13/2013  167> 12/16/13 166 > 06/21/2014   166 > 12/20/2014   167 > 08/11/2015   157 > 08/12/2016  158 >  08/12/2017  150>  08/17/2018  143     Vital signs reviewed  08/22/2021  - Note at rest 02 sats  94% on RA   General appearance:    well preserved and very pleasant amb wf nad       HEENT : Oropharynx  clear     Nasal turbinates nl    NECK :  without  apparent JVD/ palpable Nodes/TM    LUNGS: no acc muscle use,  Min kyphotic contour chest with  scattered insp/ exp rhonchi bases - no cough or wheeze   CV:  RRR  no s3 or murmur or increase in P2, and no edema   ABD:  soft and nontender with nl inspiratory excursion in the supine position. No bruits or organomegaly appreciated   MS:  Nl gait/ ext warm without deformities Or obvious joint restrictions  calf tenderness, cyanosis or clubbing    SKIN: warm and dry without lesions    NEURO:  alert, approp, nl sensorium with  no motor or cerebellar deficits apparent.      CXR PA and Lateral:   08/22/2021 :    I personally reviewed images and impression is as follows:     No interval change mostly upper lobe scarring vs one year prior          Assessment:

## 2021-08-22 NOTE — Patient Instructions (Addendum)
Make sure you check your oxygen saturation at your highest level of activity to be sure it stays over 90% and keep track of it at least once a week, more often if breathing getting worse, and let me know if losing ground.   Please remember to go to the  x-ray department  for your tests - we will call you with the results when they are available    Please schedule a follow up office visit in 6 months  call sooner if needed

## 2021-08-23 NOTE — Progress Notes (Signed)
Spoke with Allison Osborn and notified of results per Dr. Wert. Allison Osborn verbalized understanding and denied any questions. 

## 2021-11-14 ENCOUNTER — Other Ambulatory Visit: Payer: Self-pay | Admitting: *Deleted

## 2021-11-14 MED ORDER — SYMBICORT 160-4.5 MCG/ACT IN AERO: 2.0000 | INHALATION_SPRAY | Freq: Two times a day (BID) | RESPIRATORY_TRACT | 11 refills | Status: DC

## 2021-12-15 ENCOUNTER — Other Ambulatory Visit: Payer: Self-pay | Admitting: Internal Medicine

## 2021-12-17 NOTE — Telephone Encounter (Signed)
Pt called and would like to prednisone refilled- she is almost out

## 2022-02-20 ENCOUNTER — Ambulatory Visit: Payer: Medicare HMO | Admitting: Internal Medicine

## 2022-02-20 NOTE — Progress Notes (Unsigned)
Subjective:    Patient ID: Allison Osborn, female   DOB: 1945/03/05     MRN: [id number]    Brief patient profile:  25 yowf never smoker with a dx of sarcoid 2000  manifested clinically with both skin rash around ankles and cough and extreme fatigue and recurrent since diagnosis  although in retrospect she had previous parenchymal changes on cxr  suggestive of sarcoid dating back at least 5 years prior to dx - completely off prednisone as of 09/16/2018    History of Present Illness  01/24/2012 f/u ov/Allison Osborn cc all smiles, no limiting sob or cough. rec If cough or short ness of breath Dulera 200  Take 2 puffs first thing in am and then another 2 puffs about 12 hours later.  Prednisone 5 mg every other day is your floor.     03/14/2021  f/u ov/Allison Osborn re: sarcoid/ asthma   maint on  symbicort 160 2bid and prednisone 5 mg one daily  Dyspnea:  walking [facility] at senior center indoor or outdoor slow pace with rollator ok   Cough: none  Sleeping: no resp cc  SABA use: never  02: none  Rec To get the most out of exercise, you need to be continuously aware that you are short of breath, but never out of breath, for at least 30 minutes daily Make sure you check your oxygen saturations at highest level of activity once a week  Please schedule a follow up visit in  6 months but call sooner if needed with cxr    08/22/2021  f/u ov/Allison Osborn re: sarcoid maint on  pred 5 and symbicort 160    Chief Complaint  Patient presents with   Follow-up   Dyspnea:  15 min with rollator and stops due back/ not checking  Cough: none  Sleeping: no resp cc one pillow/ flat bed  SABA use: none  02: has portable concentrator Covid status: 3 vax/ never infected Rec Make sure you check your oxygen saturation at your highest level of activity to be sure it stays over 90%   02/21/2022  f/u ov/Allison Osborn re: sarcoid   maint on prednisone 5 mg / symbicort 160 2 bid   Chief Complaint  Patient presents with   Follow-up    Doing well.   Dyspnea:  goes to senior care center 2 x weekly planning on 3  Cough: none  Sleeping: flat bed one pillow  SABA use:  none  02: none - still as Inogen  Covid status:   uptodate vax     No obvious day to day or daytime variability or assoc excess/ purulent sputum or mucus plugs or hemoptysis or cp or chest tightness, subjective wheeze or overt sinus or hb symptoms.   Sleeping  without nocturnal  or early am exacerbation  of respiratory  c/o's or need for noct saba. Also denies any obvious fluctuation of symptoms with weather or environmental changes or other aggravating or alleviating factors except as outlined above   No unusual exposure hx or h/o childhood pna/ asthma or knowledge of premature birth.  Current Allergies, Complete Past Medical History, Past Surgical History, Family History, and Social History were reviewed in [employer] record.  ROS  The following are not active complaints unless bolded Hoarseness, sore throat, dysphagia, dental problems, itching, sneezing,  nasal congestion or discharge of excess mucus or purulent secretions, ear ache,   fever, chills, sweats, unintended wt loss or wt gain, classically pleuritic or exertional cp,  orthopnea pnd or arm/hand swelling  or leg swelling, presyncope, palpitations, abdominal pain, anorexia, nausea, vomiting, diarrhea  or change in bowel habits or change in bladder habits, change in stools or change in urine, dysuria, hematuria,  rash, arthralgias, visual complaints, headache, numbness, weakness/ R leg radiculopathy  or ataxia or problems with walking uses rollator  or coordination,  change in mood or  memory.        Current Meds  Medication Sig   aspirin 81 MG tablet Take 81 mg by mouth daily.   cetirizine (ZYRTEC) 10 MG tablet Take 10 mg by mouth daily as needed.   famotidine (PEPCID) 20 MG tablet Take 20 mg by mouth daily.   Meloxicam 10 MG CAPS    predniSONE (DELTASONE) 5 MG tablet TAKE 1 TO 2 TABLETS  BY MOUTH DAILY   SYMBICORT 160-4.5 MCG/ACT inhaler Inhale 2 puffs into the lungs 2 (two) times daily.   telmisartan-hydrochlorothiazide (MICARDIS HCT) 80-25 MG per tablet Take 1/2 tab by mouth once daily            Past Medical History:  HYPERTENSION (ICD-401.9)  WEIGHT GAIN  - Target wt = 179 for BMI < 30  GERD (ICD-530.81)  SARCOIDOSIS (ICD-135)  rash/cough...................................Marland KitchenWert        Objective:   Physical Exam  Wts  02/21/2022       136  08/22/2021       136  03/14/2021       133  08/14/2020      134 01/20/2020       138 09/13/2019    139 06/07/2019    138  04/26/2019    136  04/02/2019   136  03/26/2019   136  03/24/2019   136 01/19/2019     144  09/28/2018  143  wt 188 July 27, 2008 > 177 July 28, 2009  > 178 08/10/2010  > 05/28/2011  179 > 09/03/2011  178 > 179 01/24/2012 > 06/22/2012  178 > 01/05/2013  176 > 07/13/2013  167> 12/16/13 166 > 06/21/2014   166 > 12/20/2014   167 > 08/11/2015   157 > 08/12/2016  158 >  08/12/2017  150>  08/17/2018  143     Vital signs reviewed  02/21/2022  - Note at rest 02 sats  95% on RA   General appearance:    pleasant elderly wf / rollator      HEENT : Oropharynx  clear         NECK :  without  apparent JVD/ palpable Nodes/TM    LUNGS: no acc muscle use,  Min kyphotic contour chest which is clear to A and P bilaterally without cough on insp or exp maneuvers   CV:  RRR  no s3 or murmur or increase in P2, and no edema   ABD:  soft and nontender with nl inspiratory excursion in the supine position. No bruits or organomegaly appreciated   MS:  Nl gait/ ext warm without deformities Or obvious joint restrictions  calf tenderness, cyanosis or clubbing    SKIN: warm and dry without lesions    NEURO:  alert, approp, nl sensorium with  no motor or cerebellar deficits apparent.         CXR PA and Lateral:   02/21/2022 :    I personally reviewed images and impression is as follows:   Severe upper lobe fibrotic features, no change baseline      Assessment:

## 2022-02-21 ENCOUNTER — Encounter: Payer: Self-pay | Admitting: Internal Medicine

## 2022-02-21 ENCOUNTER — Ambulatory Visit (INDEPENDENT_AMBULATORY_CARE_PROVIDER_SITE_OTHER): Payer: Medicare HMO

## 2022-02-21 ENCOUNTER — Ambulatory Visit: Payer: Medicare HMO | Admitting: Internal Medicine

## 2022-02-21 VITALS — BP 128/60 | HR 110 | Temp 97.6°F | Ht 64.0 in | Wt 136.2 lb

## 2022-02-21 DIAGNOSIS — D869 Sarcoidosis, unspecified: Secondary | ICD-10-CM

## 2022-02-21 NOTE — Patient Instructions (Signed)
Please remember to go to the  x-ray department  for your tests - we will call you with the results when they are available    No change in meds   Please schedule a follow up visit in 6  months but call sooner if needed

## 2022-02-21 NOTE — Assessment & Plan Note (Addendum)
Onset of symptoms around 1995 / dx 2000     - Pred restarted daily 04/2007 > every third day dosing March 01, 2009 > taper off by May 14 2009 > flared by middle of May 2011 > resume prednisone May 28 2009 for sob/cough  - dulera 100 for ? airway involvement > HFA 25% July 28, 2009 > 50% September 15, 2009, rx 200 dulera > pt stopped 05/2010 s flare of symptoms - Reduced dose to 2.5 mg qod 02/25/2011  > adrenal insuff symptoms> resolved on 5 mg qod - PFT's 01/24/2012  FEV 1.28 (59%) 56 ratio and no better p B2, DLCO 83% - PFT's  08/11/2015  FEV1 1.14 (49 % ) ratio 59  p 6 % improvement from saba p nothing prior to study with DLCO  64/62 % corrects to 98  % for alv volume   - 08/17/2018   Walked RA x one lap =  approx 250 ft slow pace > stopped due to desats to 87% (walking more than she usually does ) while on pred 2.5 mg qod > d/c completely 09/15/2018 > mild nausea resolved p after 11/15/2018  - resumed daily prednisone 03/18/19 for desats with ex and calcium 10.7  - as of 03/14/2021 floor is 5 mg and ceiling is 10 mg Pred daily   No evidenece of active sarcoid radiographically or clinically on symb160 2bid and  floor of pred 5 mg daily and flares with lower doses   Rec No change rx F/u q 16m, sooner prn          Each maintenance medication was reviewed in detail including emphasizing most importantly the difference between maintenance and prns and under what circumstances the prns are to be triggered using an action plan format where appropriate.  Total time for H and P, chart review, counseling, reviewing hfa device(s) and generating customized AVS unique to this office visit / same day charting = 20 min

## 2022-04-13 IMAGING — DX DG CHEST 2V
2 series · 2 of 2 positions shown · non-contrast
Comparison: 01/20/2020

CLINICAL DATA: Follow-up of sarcoidosis and right-sided
pneumothorax

EXAM:
CHEST - 2 VIEW

[chest pa]
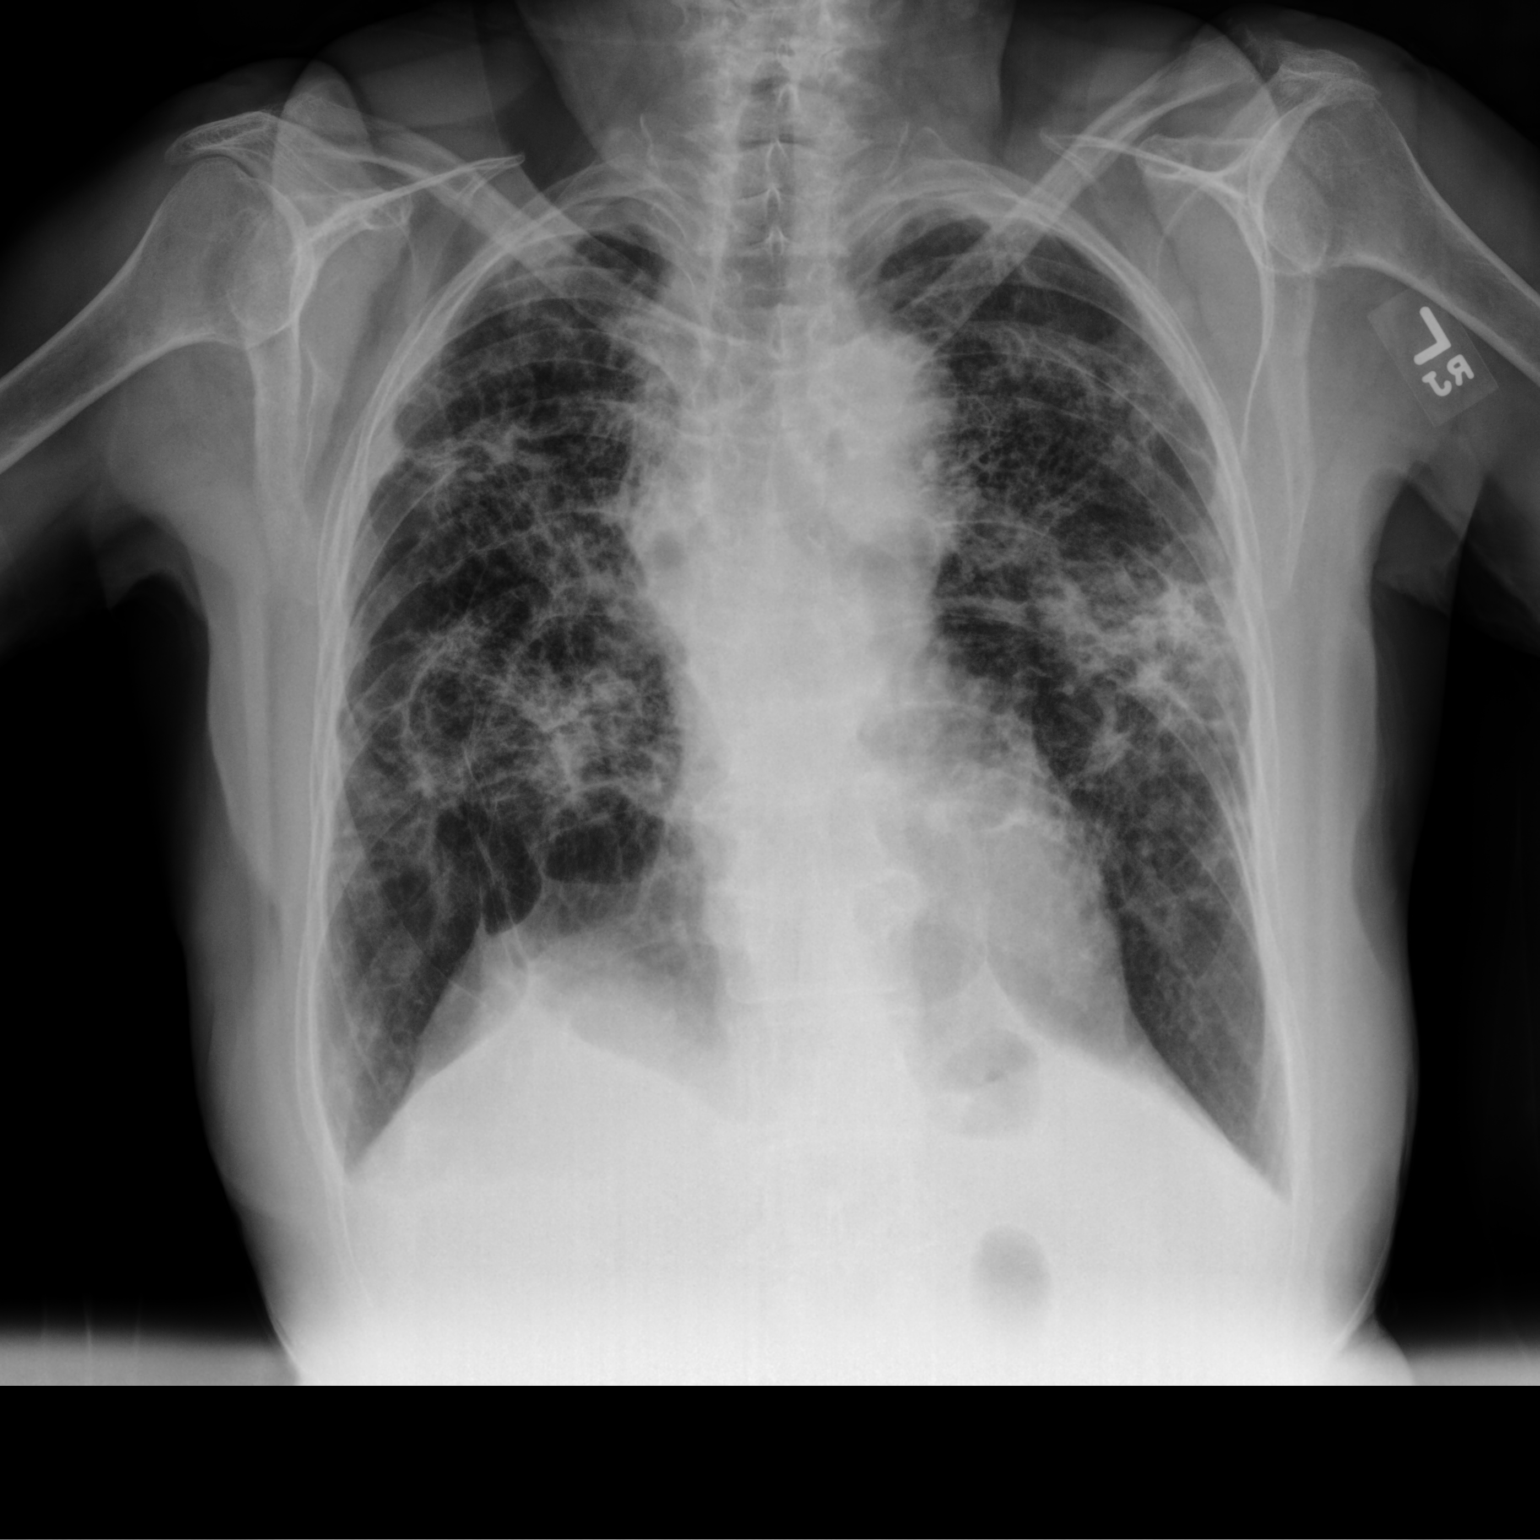

[chest lat]
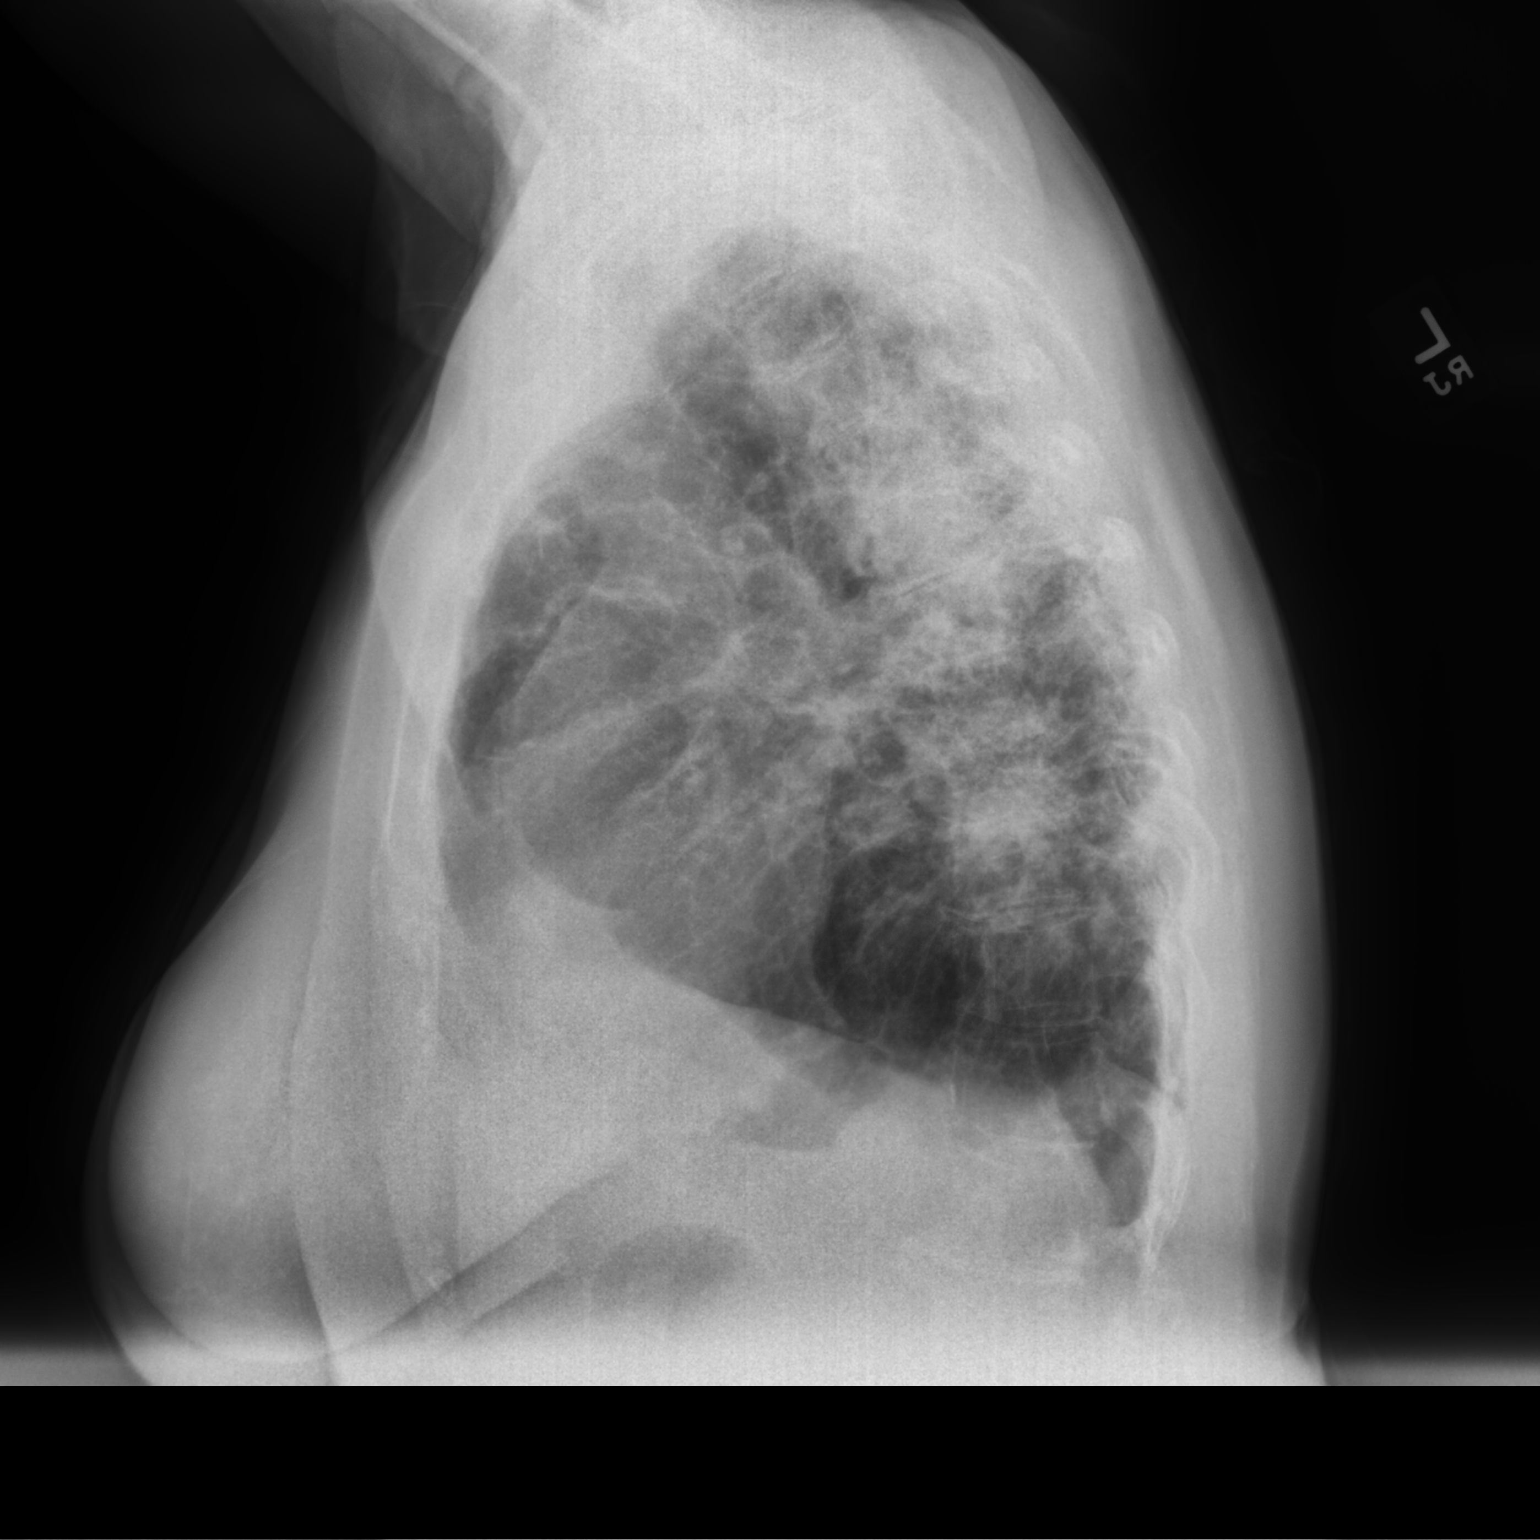

[2 of 2 positions shown; findings below may reference images not displayed]

FINDINGS: Midline trachea. Normal heart size. No pleural effusion or
pneumothorax. Biapical pleural thickening. Similar appearance of
bilateral, upper lobe predominant areas of fibrosis with
architectural distortion and hilar retraction. No superimposed lobar
consolidation.
IMPRESSION: Similar appearance of bilateral upper lobe predominant fibrosis
secondary to sarcoidosis.

No pneumothorax or other acute complication.

## 2022-08-05 ENCOUNTER — Other Ambulatory Visit: Payer: Self-pay | Admitting: Internal Medicine

## 2022-08-05 ENCOUNTER — Telehealth: Payer: Self-pay | Admitting: Internal Medicine

## 2022-08-05 NOTE — Telephone Encounter (Signed)
[facility] on [address] in [place]   PT states she is out of Pred and the Pharm put thru a request that was denied. She has appt w/Dr. Sherene Sires and has been seen in the last year, Please call PT @ [phone]

## 2022-08-08 ENCOUNTER — Other Ambulatory Visit: Payer: Self-pay | Admitting: Internal Medicine

## 2022-08-08 MED ORDER — PREDNISONE 5 MG PO TABS: ORAL_TABLET | ORAL | 3 refills | Status: DC

## 2022-08-08 NOTE — Telephone Encounter (Signed)
Called and spoke with patient. She stated that she had requested a refill on her prednisone back on 7/22 and never heard anything back from our office. She called the pharmacy yesterday and was first told that they did have the prescription and it would be ready in 2 hours. She called the pharmacy again 3 hours later and was told that they hadnt received any prescriptions.   I advised her that I would send a new prescription since it sounds like they are having issues with their system. She verbalized understanding.   Nothing further needed at time of call.

## 2022-08-08 NOTE — Telephone Encounter (Signed)
Patient is calling again regarding her refill for Prednisone.  She stated she only has one pill left and needs to get this refilled asap.  Please advise and call patient to confirm.  CB# [phone]

## 2022-08-25 NOTE — Progress Notes (Unsigned)
Subjective:    Patient ID: Allison Osborn, female   DOB: 1945/02/06     MRN: [id number]    Brief patient profile:  22 yowf never smoker with a dx of sarcoid 2000  manifested clinically with both skin rash around ankles and cough and extreme fatigue and recurrent since diagnosis  although in retrospect she had previous parenchymal changes on cxr  suggestive of sarcoid dating back at least 5 years prior to dx    History of Present Illness  01/24/2012 f/u ov/Leann Mayweather cc all smiles, no limiting sob or cough. rec If cough or short ness of breath Dulera 200  Take 2 puffs first thing in am and then another 2 puffs about 12 hours later.  Prednisone 5 mg every other day is your floor.     03/14/2021  f/u ov/Jeriel Vivanco re: sarcoid/ asthma   maint on  symbicort 160 2bid and prednisone 5 mg one daily  Dyspnea:  [facility]ing [facility] senior center indoor or outdoor slow pace with rollator ok   Cough: none  Sleeping: no resp cc  SABA use: never  02: none  Rec To get the most out of exercise, you need to be continuously aware that you are short of breath, but never out of breath, for at least 30 minutes daily Make sure you check your oxygen saturations at highest level of activity once a week    02/21/2022  f/u ov/Lonnie Reth re: sarcoid   maint on prednisone 5 mg / symbicort 160 2 bid   Chief Complaint  Patient presents with   Follow-up    Doing well.  Dyspnea:  goes to senior care center 2 x weekly planning on 3  Cough: none  Sleeping: flat bed one pillow  SABA use:  none  02: none - still as Inogen  Covid status:   uptodate vax Rec No change rx     08/26/2022  f/u ov/Jshon Ibe re: sarcoidosis  maint on symbicort 160 and pred 5 mg daily   Chief Complaint  Patient presents with   Follow-up    Breathing is doing well. She has had some increased joint pain.   Dyspnea:  not very active / using rollator helps with arthritis which slows her down more than her breathing Cough: resolved p bout of pneumonia  Sleeping:  flat bed one pillow no problems  SABA use: none  02: none     No obvious day to day or daytime variability or assoc excess/ purulent sputum or mucus plugs or hemoptysis or cp or chest tightness, subjective wheeze or overt sinus or hb symptoms.     Also denies any obvious fluctuation of symptoms with weather or environmental changes or other aggravating or alleviating factors except as outlined above   No unusual exposure hx or h/o childhood pna/ asthma or knowledge of premature birth.  Current Allergies, Complete Past Medical History, Past Surgical History, Family History, and Social History were reviewed in [employer] record.  ROS  The following are not active complaints unless bolded Hoarseness, sore throat, dysphagia, dental problems, itching, sneezing,  nasal congestion or discharge of excess mucus or purulent secretions, ear ache,   fever, chills, sweats, unintended wt loss or wt gain, classically pleuritic or exertional cp,  orthopnea pnd or arm/hand swelling  or leg swelling, presyncope, palpitations, abdominal pain, anorexia, nausea, vomiting, diarrhea  or change in bowel habits or change in bladder habits, change in stools or change in urine, dysuria, hematuria,  rash, arthralgias, visual  complaints, headache, numbness, weakness or ataxia or problems with walking or coordination/ uses rollator,  change in mood or  memory.        Current Meds  Medication Sig   aspirin 81 MG tablet Take 81 mg by mouth daily.   cetirizine (ZYRTEC) 10 MG tablet Take 10 mg by mouth daily as needed.   famotidine (PEPCID) 20 MG tablet Take 20 mg by mouth daily.   Meloxicam 10 MG CAPS    predniSONE (DELTASONE) 5 MG tablet TAKE 1 TO 2 TABLETS BY MOUTH DAILY   SYMBICORT 160-4.5 MCG/ACT inhaler Inhale 2 puffs into the lungs 2 (two) times daily.   telmisartan-hydrochlorothiazide (MICARDIS HCT) 80-25 MG per tablet Take 1/2 tab by mouth once daily           Past Medical History:   HYPERTENSION (ICD-401.9)  WEIGHT GAIN  - Target wt = 179 for BMI < 30  GERD (ICD-530.81)  SARCOIDOSIS (ICD-135)  rash/cough...................................Marland KitchenWert        Objective:   Physical Exam  Wts  08/26/2022     130  02/21/2022       136  08/22/2021       136  03/14/2021       133  08/14/2020      134 01/20/2020       138 09/13/2019    139 06/07/2019    138  04/26/2019    136  04/02/2019   136  03/26/2019   136  03/24/2019   136 01/19/2019     144  09/28/2018  143  wt 188 July 27, 2008 > 177 July 28, 2009  > 178 08/10/2010  > 05/28/2011  179 > 09/03/2011  178 > 179 01/24/2012 > 06/22/2012  178 > 01/05/2013  176 > 07/13/2013  167> 12/16/13 166 > 06/21/2014   166 > 12/20/2014   167 > 08/11/2015   157 > 08/12/2016  158 >  08/12/2017  150>  08/17/2018  143      Vital signs reviewed  08/26/2022  - Note at rest 02 sats  97% on RA   General appearance:    pleasant amb wf / using rollator   HEENT : Oropharynx  clear      NECK :  without  apparent JVD/ palpable Nodes/TM    LUNGS: no acc muscle use,  Nl contour chest with min basilar  insp crackles and a few exp rhonchi bilaterally    CV:  RRR  no s3 or murmur or increase in P2, and no edema   ABD:  soft and nontender with nl inspiratory excursion in the supine position. No bruits or organomegaly appreciated   MS:  Nl gait/ ext warm without deformities Or obvious joint restrictions  calf tenderness, cyanosis or clubbing    SKIN: warm and dry without lesions    NEURO:  alert, approp, nl sensorium with  no motor or cerebellar deficits apparent.       .       Assessment:

## 2022-08-26 ENCOUNTER — Ambulatory Visit: Payer: Medicare HMO | Admitting: Internal Medicine

## 2022-08-26 ENCOUNTER — Encounter: Payer: Self-pay | Admitting: Internal Medicine

## 2022-08-26 VITALS — BP 124/74 | HR 80 | Ht 64.0 in | Wt 130.4 lb

## 2022-08-26 DIAGNOSIS — D869 Sarcoidosis, unspecified: Secondary | ICD-10-CM

## 2022-08-26 DIAGNOSIS — J45991 Cough variant asthma: Secondary | ICD-10-CM

## 2022-08-26 MED ORDER — BUDESONIDE-FORMOTEROL FUMARATE 80-4.5 MCG/ACT IN AERO: INHALATION_SPRAY | RESPIRATORY_TRACT | 12 refills | Status: DC

## 2022-08-26 NOTE — Assessment & Plan Note (Signed)
08/12/2017    continue symbicort 160 2bid  - Eos 03/24/2019  = 0.8 p finishing prednisone short course 03/23/19  - 03/24/2019  After extensive coaching inhaler device,  effectiveness =    90% > continue symbicort 160 2bid and daily prednisone for sarcoid     >>> 08/26/2022 try symbicort 80 2bid if tol due to "bad pna" in [place] and risk of high dose ICS in this setting

## 2022-08-26 NOTE — Patient Instructions (Signed)
Change symbicort 160 to  one twice daily if tolerated and used up then reduce to the 80 strength Take 2 puffs first thing in am and then another 2 puffs about 12 hours later.    Predisone floor is 5 mg and ceiling is 10 mg daily    Please schedule a follow up visit in 3 months but call sooner if needed - bring inhaler

## 2022-08-26 NOTE — Assessment & Plan Note (Addendum)
Onset of symptoms around 1995 / dx 2000     - Pred restarted daily 04/2007 > every third day dosing March 01, 2009 > taper off by May 14 2009 > flared by middle of May 2011 > resume prednisone May 28 2009 for sob/cough  - dulera 100 for ? airway involvement > HFA 25% July 28, 2009 > 50% September 15, 2009, rx 200 dulera > pt stopped 05/2010 s flare of symptoms - Reduced dose to 2.5 mg qod 02/25/2011  > adrenal insuff symptoms> resolved on 5 mg qod - PFT's 01/24/2012  FEV 1.28 (59%) 56 ratio and no better p B2, DLCO 83% - PFT's  08/11/2015  FEV1 1.14 (49 % ) ratio 59  p 6 % improvement from saba p nothing prior to study with DLCO  64/62 % corrects to 98  % for alv volume   - 08/17/2018   Walked RA x one lap =  approx 250 ft slow pace > stopped due to desats to 87% (walking more than she usually does ) while on pred 2.5 mg qod > d/c completely 09/15/2018 > mild nausea resolved p after 11/15/2018  - resumed daily prednisone 03/18/19 for desats with ex and calcium 10.7  - as of 03/14/2021 floor is 5 mg and ceiling is 10 mg Pred daily  - 08/26/2022   Walked on RA  x  2  lap(s) =  approx 300  ft  @ rollator/slow pace, stopped due to arthritis pain  with lowest 02 sats 94%   Since she needs the prednisone for systemic effects and had a recent pna on symbicort 160 rec  1) reduce symbicort if tol to 80 bid 2) continue same ceiling and floor  3) f/u in 3 months with inhalers in hand to verify effective hfa given arthritic issues and advancing age  Each maintenance medication was reviewed in detail including emphasizing most importantly the difference between maintenance and prns and under what circumstances the prns are to be triggered using an action plan format where appropriate.  Total time for H and P, chart review, counseling, reviewing hfa  device(s) , directly observing portions of ambulatory 02 saturation study/ and generating customized AVS unique to this office visit / same day charting = 30 min

## 2022-12-08 NOTE — Progress Notes (Unsigned)
Subjective:    Patient ID: Allison Osborn, female   DOB: [birth year]     MRN: [id number]    Brief patient profile:  89 yowf never smoker with a dx of sarcoid 2000  manifested clinically with both skin rash around ankles and cough and extreme fatigue and recurrent since diagnosis  although in retrospect she had previous parenchymal changes on cxr  suggestive of sarcoid dating back at least 5 years prior to dx    History of Present Illness  01/24/2012 f/u ov/Makensey Rego cc all smiles, no limiting sob or cough. rec If cough or short ness of breath Dulera 200  Take 2 puffs first thing in am and then another 2 puffs about 12 hours later.  Prednisone 5 mg every other day is your floor.     03/14/2021  f/u ov/Lesette Frary re: sarcoid/ asthma   maint on  symbicort 160 2bid and prednisone 5 mg one daily  Dyspnea:  walking track at senior center indoor or outdoor slow pace with rollator ok   Cough: none  Sleeping: no resp cc  SABA use: never  02: none  Rec To get the most out of exercise, you need to be continuously aware that you are short of breath, but never out of breath, for at least 30 minutes daily Make sure you check your oxygen saturations at highest level of activity once a week     08/26/2022  f/u ov/Marquette Piontek re: sarcoidosis  maint on symbicort 160 and pred 5 mg daily   Chief Complaint  Patient presents with   Follow-up    Breathing is doing well. She has had some increased joint pain.   Dyspnea:  not very active / using rollator helps with arthritis which slows her down more than her breathing Cough: resolved p bout of pneumonia  Sleeping: flat bed one pillow no problems  SABA use: none  02: none Rec Change symbicort 160 to  one twice daily if tolerated and used up then reduce to the 80 strength Take 2 puffs first thing in am and then another 2 puffs about 12 hours later.  Predisone floor is 5 mg and ceiling is 10 mg daily   Please schedule a follow up visit in 3 months but call sooner if  needed - bring inhalers    12/09/2022  f/u ov/[place] office/Lucien Budney re: *** maint on *** did *** bring inhalers  No chief complaint on file.   Dyspnea:  *** Cough: *** Sleeping: ***   resp cc  SABA use: *** 02: ***  Lung cancer screening: ***   No obvious day to day or daytime variability or assoc excess/ purulent sputum or mucus plugs or hemoptysis or cp or chest tightness, subjective wheeze or overt sinus or hb symptoms.    Also denies any obvious fluctuation of symptoms with weather or environmental changes or other aggravating or alleviating factors except as outlined above   No unusual exposure hx or h/o childhood pna/ asthma or knowledge of premature birth.  Current Allergies, Complete Past Medical History, Past Surgical History, Family History, and Social History were reviewed in [employer] record.  ROS  The following are not active complaints unless bolded Hoarseness, sore throat, dysphagia, dental problems, itching, sneezing,  nasal congestion or discharge of excess mucus or purulent secretions, ear ache,   fever, chills, sweats, unintended wt loss or wt gain, classically pleuritic or exertional cp,  orthopnea pnd or arm/hand swelling  or leg swelling, presyncope, palpitations, abdominal pain,  anorexia, nausea, vomiting, diarrhea  or change in bowel habits or change in bladder habits, change in stools or change in urine, dysuria, hematuria,  rash, arthralgias, visual complaints, headache, numbness, weakness or ataxia or problems with walking or coordination,  change in mood or  memory.        No outpatient medications have been marked as taking for the 12/09/22 encounter (Appointment) with Nyoka Cowden, MD.              Past Medical History:  HYPERTENSION (ICD-401.9)  WEIGHT GAIN  - Target wt = 179 for BMI < 30  GERD (ICD-530.81)  SARCOIDOSIS (ICD-135)  rash/cough...................................Marland KitchenWert        Objective:   Physical  Exam  Wts  12/09/2022    ***  08/26/2022     130  02/21/2022       136  08/22/2021       136  03/14/2021       133  08/14/2020      134 01/20/2020       138 09/13/2019    139 06/07/2019    138  04/26/2019    136  04/02/2019   136  03/26/2019   136  03/24/2019   136 01/19/2019     144  09/28/2018  143  wt 188 July 27, 2008 > 177 July 28, 2009  > 178 08/10/2010  > 05/28/2011  179 > 09/03/2011  178 > 179 01/24/2012 > 06/22/2012  178 > 01/05/2013  176 > 07/13/2013  167> 12/16/13 166 > 06/21/2014   166 > 12/20/2014   167 > 08/11/2015   157 > 08/12/2016  158 >  08/12/2017  150>  08/17/2018  143        Vital signs reviewed  12/09/2022  - Note at rest 02 sats  ***% on ***   General appearance:    ***   min basilar  insp crackles and a few exp rhonchi bilaterally ***         Assessment:

## 2022-12-09 ENCOUNTER — Ambulatory Visit: Payer: Medicare HMO | Admitting: Internal Medicine

## 2022-12-09 ENCOUNTER — Encounter: Payer: Self-pay | Admitting: Internal Medicine

## 2022-12-09 VITALS — BP 128/64 | HR 99 | Temp 97.8°F | Ht 64.0 in | Wt 130.6 lb

## 2022-12-09 DIAGNOSIS — J45991 Cough variant asthma: Secondary | ICD-10-CM

## 2022-12-09 DIAGNOSIS — D869 Sarcoidosis, unspecified: Secondary | ICD-10-CM

## 2022-12-09 MED ORDER — PREDNISONE 5 MG PO TABS: ORAL_TABLET | ORAL | 3 refills | Status: DC

## 2022-12-09 NOTE — Patient Instructions (Addendum)
Work on inhaler technique:  relax and gently blow all the way out then take a nice smooth full deep breath back in, triggering the inhaler at same time you start breathing in.  Hold breath in for at least  5 seconds if you can. Blow out symbicort  thru nose. Rinse and gargle with water when done.  If mouth or throat bother you at all,  try brushing teeth/gums/tongue with arm and hammer toothpaste/ make a slurry and gargle and spit out.   >>>  Remember how golfers warm up by taking practice swings - do this with an empty inhaler   Please schedule a follow up visit in 6  months but call sooner if needed

## 2022-12-09 NOTE — Assessment & Plan Note (Signed)
08/12/2017    continue symbicort 160 2bid  - Eos 03/24/2019  = 0.8 p finishing prednisone short course 03/23/19  - 03/24/2019  After extensive coaching inhaler device,  effectiveness =    90% > continue symbicort 160 2bid and daily prednisone for sarcoid    - 08/26/2022 try symbicort 80 2bid if tol due to "bad pna" in [place] and risk of high dose ICS in this setting  - 12/09/2022  After extensive coaching inhaler device,  effectiveness =    50% (delayed trigger)   Despite poor hfa baseline, All goals of chronic asthma control met including optimal function and elimination of symptoms with minimal need for rescue therapy.  Contingencies discussed in full including contacting this office immediately if not controlling the symptoms using the rule of two's.     F/u can be q 6 m sooner prn         Each maintenance medication was reviewed in detail including emphasizing most importantly the difference between maintenance and prns and under what circumstances the prns are to be triggered using an action plan format where appropriate.  Total time for H and P, chart review, counseling, reviewing hfa device(s) and generating customized AVS unique to this office visit / same day charting = 25 min

## 2022-12-09 NOTE — Assessment & Plan Note (Signed)
Onset of symptoms around 1995 / dx 2000     - Pred restarted daily 04/2007 > every third day dosing March 01, 2009 > taper off by May 14 2009 > flared by middle of May 2011 > resume prednisone May 28 2009 for sob/cough  - dulera 100 for ? airway involvement > HFA 25% July 28, 2009 > 50% September 15, 2009, rx 200 dulera > pt stopped 05/2010 s flare of symptoms - Reduced dose to 2.5 mg qod 02/25/2011  > adrenal insuff symptoms> resolved on 5 mg qod - PFT's 01/24/2012  FEV 1.28 (59%) 56 ratio and no better p B2, DLCO 83% - PFT's  08/11/2015  FEV1 1.14 (49 % ) ratio 59  p 6 % improvement from saba p nothing prior to study with DLCO  64/62 % corrects to 98  % for alv volume   - 08/17/2018   Walked RA x one lap =  approx 250 ft slow pace > stopped due to desats to 87% (walking more than she usually does ) while on pred 2.5 mg qod > d/c completely 09/15/2018 > mild nausea resolved p after 11/15/2018  - resumed daily prednisone 03/18/19 for desats with ex and calcium 10.7  - as of 03/14/2021 floor is 5 mg and ceiling is 10 mg Pred daily  - 08/26/2022   Walked on RA  x  2  lap(s) =  approx 300  ft  @ rollator/slow pace, stopped due to arthritis pain  with lowest 02 sats 94%   The goal with a chronic steroid dependent illness is always arriving at the lowest effective dose that controls the disease/symptoms and not accepting a set "formula" which is based on statistics or guidelines that don't always take into account patient  variability or the natural hx of the dz in every individual patient, which may well vary over time.  For now therefore I recommend the patient maintain  floor of 5mg  and ceiling of 10 mg daily

## 2023-04-02 ENCOUNTER — Telehealth: Payer: Self-pay | Admitting: Internal Medicine

## 2023-04-02 MED ORDER — PREDNISONE 5 MG PO TABS: ORAL_TABLET | ORAL | 3 refills | Status: DC

## 2023-04-02 NOTE — Telephone Encounter (Signed)
 Patient need prednisone 5mg  refilled.   Pharmacy: [facility] on [address] in [place]

## 2023-04-02 NOTE — Telephone Encounter (Signed)
 I have sent refill on pred  Called the pt and there was no answer- left her a detailed msg letting her know this was done  Nothing further needed

## 2023-06-21 NOTE — Progress Notes (Deleted)
 Subjective:    Patient ID: Allison Osborn, female   DOB: 1945-06-21     MRN: [id number]    Brief patient profile:  59 yowf never smoker with a dx of sarcoid 2000  manifested clinically with both skin rash around ankles and cough and extreme fatigue and recurrent since diagnosis  although in retrospect she had previous parenchymal changes on cxr  suggestive of sarcoid dating back at least 5 years prior to dx     History of Present Illness  01/24/2012 f/u ov/Allison Osborn cc all smiles, no limiting sob or cough. rec If cough or short ness of breath Dulera 200  Take 2 puffs first thing in am and then another 2 puffs about 12 hours later.  Prednisone  5 mg every other day is your floor.     03/14/2021  f/u ov/Allison Osborn re: sarcoid/ asthma   maint on  symbicort  160 2bid and prednisone  5 mg one daily  Dyspnea:  walking track at [facility] center indoor or outdoor slow pace with rollator ok   Cough: none  Sleeping: no resp cc  SABA use: never  02: none  Rec To get the most out of exercise, you need to be continuously aware that you are short of breath, but never out of breath, for at least 30 minutes daily Make sure you check your oxygen saturations at highest level of activity once a week     08/26/2022  f/u ov/Allison Osborn re: sarcoidosis  maint on symbicort  160 and pred 5 mg daily   Chief Complaint  Patient presents with   Follow-up    Breathing is doing well. She has had some increased joint pain.   Dyspnea:  not very active / using rollator helps with arthritis which slows her down more than her breathing Cough: resolved p bout of pneumonia  Sleeping: flat bed one pillow no problems  SABA use: none  02: none Rec Change symbicort  160 to  one twice daily if tolerated and used up then reduce to the 80 strength Take 2 puffs first thing in am and then another 2 puffs about 12 hours later.  Predisone floor is 5 mg and ceiling is 10 mg daily   Please schedule a follow up visit in 3 months but call sooner if  needed - bring inhalers    12/09/2022  f/u ov/[place] office/Allison Osborn re: sarcoidosis with airways involvement  maint on symbicort  80 2bid  prednisone  at 5 mg daily  did  bring inhalers  Chief Complaint  Patient presents with   Follow-up    Doing well.  No sx noted.  May need refill on prednisone .   Dyspnea:  [employer] walking on rollator/planning to restart gym Cough: none  Sleeping: flat bed pillow s resp cc  SABA use: none  02: none   Rec   06/23/2023  f/u ov/Allison Osborn re: ***   maint on ***  No chief complaint on file.   Dyspnea:  *** Cough: *** Sleeping: *** resp cc  SABA use: *** 02: ***  Lung cancer screening :  ***    No obvious day to day or daytime variability or assoc excess/ purulent sputum or mucus plugs or hemoptysis or cp or chest tightness, subjective wheeze or overt sinus or hb symptoms.    Also denies any obvious fluctuation of symptoms with weather or environmental changes or other aggravating or alleviating factors except as outlined above   No unusual exposure hx or h/o childhood pna/ asthma or knowledge of premature birth.  Current Allergies, Complete Past Medical History, Past Surgical History, Family History, and Social History were reviewed in [employer] record.  ROS  The following are not active complaints unless bolded Hoarseness, sore throat, dysphagia, dental problems, itching, sneezing,  nasal congestion or discharge of excess mucus or purulent secretions, ear ache,   fever, chills, sweats, unintended wt loss or wt gain, classically pleuritic or exertional cp,  orthopnea pnd or arm/hand swelling  or leg swelling, presyncope, palpitations, abdominal pain, anorexia, nausea, vomiting, diarrhea  or change in bowel habits or change in bladder habits, change in stools or change in urine, dysuria, hematuria,  rash, arthralgias, visual complaints, headache, numbness, weakness or ataxia or problems with walking or coordination,  change in mood  or  memory.        No outpatient medications have been marked as taking for the 06/23/23 encounter (Appointment) with [name], MD.             Past Medical History:  HYPERTENSION (ICD-401.9)  WEIGHT GAIN  - Target wt = 179 for BMI < 30  GERD (ICD-530.81)  SARCOIDOSIS (ICD-135)  rash/cough...................................Allison Osborn        Objective:   Physical Exam  Wts  06/23/2023         ***  12/09/2022    130  08/26/2022     130  02/21/2022       136  08/22/2021       136  03/14/2021       133  08/14/2020      134 01/20/2020       138 09/13/2019    139 06/07/2019    138  04/26/2019    136  04/02/2019   136  03/26/2019   136  03/24/2019   136 01/19/2019     144  09/28/2018  143  wt 188 July 27, 2008 > 177 July 28, 2009  > 178 08/10/2010  > 05/28/2011  179 > 09/03/2011  178 > 179 01/24/2012 > 06/22/2012  178 > 01/05/2013  176 > 07/13/2013  167> 12/16/13 166 > 06/21/2014   166 > 12/20/2014   167 > 08/11/2015   157 > 08/12/2016  158 >  08/12/2017  150>  08/17/2018  143      Vital signs reviewed  06/23/2023  - Note at rest 02 sats  ***% on ***   General appearance:    ***      slt kyphotic***            Assessment:

## 2023-06-23 ENCOUNTER — Ambulatory Visit: Admitting: Internal Medicine

## 2023-06-23 NOTE — Patient Instructions (Incomplete)
 Subjective:    Patient ID: Allison Osborn, female   DOB: January 16, 1945     MRN: [id number]    Brief patient profile:  6 yowf never smoker with a dx of sarcoid 2000  manifested clinically with both skin rash around ankles and cough and extreme fatigue and recurrent since diagnosis  although in retrospect she had previous parenchymal changes on cxr  suggestive of sarcoid dating back at least 5 years prior to dx     History of Present Illness  01/24/2012 f/u ov/Yosselyn Tax cc all smiles, no limiting sob or cough. rec If cough or short ness of breath Dulera 200  Take 2 puffs first thing in am and then another 2 puffs about 12 hours later.  Prednisone  5 mg every other day is your floor.     03/14/2021  f/u ov/Oniel Meleski re: sarcoid/ asthma   maint on  symbicort  160 2bid and prednisone  5 mg one daily  Dyspnea:  walking track at senior center indoor or outdoor slow pace with rollator ok   Cough: none  Sleeping: no resp cc  SABA use: never  02: none  Rec To get the most out of exercise, you need to be continuously aware that you are short of breath, but never out of breath, for at least 30 minutes daily Make sure you check your oxygen saturations at highest level of activity once a week     08/26/2022  f/u ov/Rafiel Mecca re: sarcoidosis  maint on symbicort  160 and pred 5 mg daily   Chief Complaint  Patient presents with   Follow-up    Breathing is doing well. She has had some increased joint pain.   Dyspnea:  not very active / using rollator helps with arthritis which slows her down more than her breathing Cough: resolved p bout of pneumonia  Sleeping: flat bed one pillow no problems  SABA use: none  02: none Rec Change symbicort  160 to  one twice daily if tolerated and used up then reduce to the 80 strength Take 2 puffs first thing in am and then another 2 puffs about 12 hours later.  Predisone floor is 5 mg and ceiling is 10 mg daily   Please schedule a follow up visit in 3 months but call sooner if  needed - bring inhalers    12/09/2022  f/u ov/[place] office/Jalayne Ganesh re: sarcoidosis with airways involvement  maint on symbicort  80 2bid  prednisone  at 5 mg daily  did  bring inhalers  Chief Complaint  Patient presents with   Follow-up    Doing well.  No sx noted.  May need refill on prednisone .   Dyspnea:  [employer] walking on rollator/planning to restart gym Cough: none  Sleeping: flat bed pillow s resp cc  SABA use: none  02: none   Rec Work on inhaler technique:  06/23/2023  f/u ov/Marykatherine Sherwood re: ***   maint on ***  No chief complaint on file.   Dyspnea:  *** Cough: *** Sleeping: *** resp cc  SABA use: *** 02: ***     No obvious day to day or daytime variability or assoc excess/ purulent sputum or mucus plugs or hemoptysis or cp or chest tightness, subjective wheeze or overt sinus or hb symptoms.    Also denies any obvious fluctuation of symptoms with weather or environmental changes or other aggravating or alleviating factors except as outlined above   No unusual exposure hx or h/o childhood pna/ asthma or knowledge of premature birth.  Current Allergies, Complete  Past Medical History, Past Surgical History, Family History, and Social History were reviewed in [employer] record.  ROS  The following are not active complaints unless bolded Hoarseness, sore throat, dysphagia, dental problems, itching, sneezing,  nasal congestion or discharge of excess mucus or purulent secretions, ear ache,   fever, chills, sweats, unintended wt loss or wt gain, classically pleuritic or exertional cp,  orthopnea pnd or arm/hand swelling  or leg swelling, presyncope, palpitations, abdominal pain, anorexia, nausea, vomiting, diarrhea  or change in bowel habits or change in bladder habits, change in stools or change in urine, dysuria, hematuria,  rash, arthralgias, visual complaints, headache, numbness, weakness or ataxia or problems with walking or coordination,  change in mood or   memory.        No outpatient medications have been marked as taking for the 06/23/23 encounter (Appointment) with Yoshie Kosel B, MD.               Past Medical History:  HYPERTENSION (ICD-401.9)  WEIGHT GAIN  - Target wt = 179 for BMI < 30  GERD (ICD-530.81)  SARCOIDOSIS (ICD-135)  rash/cough...................................[name]        Objective:   Physical Exam  Wts  06/23/2023         ***  12/09/2022    130  08/26/2022     130  02/21/2022       136  08/22/2021       136  03/14/2021       133  08/14/2020      134 01/20/2020       138 09/13/2019    139 06/07/2019    138  04/26/2019    136  04/02/2019   136  03/26/2019   136  03/24/2019   136 01/19/2019     144  09/28/2018  143  wt 188 July 27, 2008 > 177 July 28, 2009  > 178 08/10/2010  > 05/28/2011  179 > 09/03/2011  178 > 179 01/24/2012 > 06/22/2012  178 > 01/05/2013  176 > 07/13/2013  167> 12/16/13 166 > 06/21/2014   166 > 12/20/2014   167 > 08/11/2015   157 > 08/12/2016  158 >  08/12/2017  150>  08/17/2018  143      Vital signs reviewed  06/23/2023  - Note at rest 02 sats  ***% on ***   General appearance:    ***    slt kyphotic  contour chest with a few insp squeaks both base***         Assessment:

## 2023-09-10 ENCOUNTER — Ambulatory Visit: Admitting: Primary Care

## 2023-09-10 ENCOUNTER — Encounter: Payer: Self-pay | Admitting: Primary Care

## 2023-09-10 ENCOUNTER — Ambulatory Visit

## 2023-09-10 VITALS — BP 126/68 | HR 97 | Temp 97.7°F | Ht 64.0 in | Wt 129.2 lb

## 2023-09-10 DIAGNOSIS — J939 Pneumothorax, unspecified: Secondary | ICD-10-CM

## 2023-09-10 DIAGNOSIS — D869 Sarcoidosis, unspecified: Secondary | ICD-10-CM

## 2023-09-10 DIAGNOSIS — J45991 Cough variant asthma: Secondary | ICD-10-CM

## 2023-09-10 DIAGNOSIS — J9611 Chronic respiratory failure with hypoxia: Secondary | ICD-10-CM

## 2023-09-10 MED ORDER — BUDESONIDE-FORMOTEROL FUMARATE 80-4.5 MCG/ACT IN AERO: INHALATION_SPRAY | RESPIRATORY_TRACT | 12 refills | Status: DC

## 2023-09-10 MED ORDER — PREDNISONE 5 MG PO TABS: ORAL_TABLET | ORAL | 3 refills | Status: AC

## 2023-09-10 NOTE — Patient Instructions (Addendum)
   VISIT SUMMARY: Today, you came in for your six-month follow-up appointment. You reported feeling 'pretty good' overall, despite some recent personal stressors. You have no current respiratory symptoms, and your oxygen levels have been stable. You also mentioned a slight vision change for which you obtained new glasses this year.  YOUR PLAN: -COUGH VARIANT ASTHMA AND PULMONARY SARCOIDOSIS: Cough variant asthma is a type of asthma where the main symptom is a dry cough, and pulmonary sarcoidosis is a condition where clusters of inflammatory cells grow in the lungs. Both conditions are well-managed, and you have no symptoms like cough, chest pain, or shortness of breath. Your oxygen levels are stable. Continue taking Symbicort , 80 micrograms, two puffs in the morning and evening, and prednisone , 5 mg daily, with the option to increase to 10 mg during flare-ups. An annual chest x-ray has been ordered, and you should monitor your oxygen levels at home, watching for levels under 90%.  -HISTORY OF RESPIRATORY FAILURE: Respiratory failure is a condition where your lungs can't get enough oxygen into your blood. Your respiratory status is stable, with oxygen levels above 90% during activities, and you have not needed supplemental oxygen.  INSTRUCTIONS: Please continue with your current medications: Symbicort , 80 micrograms, two puffs in the morning and evening, and prednisone , 5 mg daily, with the option to increase to 10 mg during flare-ups. An annual chest x-ray has been ordered, so please schedule this at your earliest convenience. Monitor your oxygen levels at home and watch for levels under 90%. Follow up with us  in six months for your next appointment.  Follow-up 6 months with Dr. Darlean

## 2023-09-10 NOTE — Progress Notes (Addendum)
 @Patient  ID: Allison Osborn, female    DOB: [birth year], 78 y.o.   MRN: [id number]  Chief Complaint  Patient presents with   Sarcoidosis    Respiratory failure- no complaints    Referring provider: Ernesto Elspeth PARAS, MD  HPI: 78 year old female, never smoked. PMH significant for cough variant asthma, sarcoidosis and chronic respiratory failure. Patient of Dr. Darlean, last seen on November 2024. Maintenance regimen Symbicort  and prednisone  5mg  floor and 10mg  ceiling.   Onset of symptoms around 1995 / dx 2000     - Pred restarted daily 04/2007 > every third day dosing March 01, 2009 > taper off by May 14 2009 > flared by middle of May 2011 > resume prednisone  May 28 2009 for sob/cough  - dulera 100 for ? airway involvement > HFA 25% July 28, 2009 > 50% September 15, 2009, rx 200 dulera > pt stopped 05/2010 s flare of symptoms - Reduced dose to 2.5 mg qod 02/25/2011  > adrenal insuff symptoms> resolved on 5 mg qod - PFT's 01/24/2012  FEV 1.28 (59%) 56 ratio and no better p B2, DLCO 83% - PFT's  08/11/2015  FEV1 1.14 (49 % ) ratio 59  p 6 % improvement from saba p nothing prior to study with DLCO  64/62 % corrects to 98  % for alv volume   - 08/17/2018   Walked RA x one lap =  approx 250 ft slow pace > stopped due to desats to 87% (walking more than she usually does ) while on pred 2.5 mg qod > d/c completely 09/15/2018 > mild nausea resolved p after 11/15/2018  - resumed daily prednisone  03/18/19 for desats with ex and calcium 10.7  - as of 03/14/2021 floor is 5 mg and ceiling is 10 mg Pred daily  - 08/26/2022   Walked on RA  x  2  lap(s) =  approx 300  ft  @ rollator/slow pace, stopped due to arthritis pain  with lowest 02 sats 94%    Since she needs the prednisone  for systemic effects and had a recent pna on symbicort  160 rec   1) reduce symbicort  if tol to 80 bid 2) continue same ceiling and floor  3) f/u in 3 months with inhalers in hand to verify effective hfa given arthritic issues and advancing  age        09/10/2023 Discussed the use of AI scribe software for clinical note transcription with the patient, who gave verbal consent to proceed. History of Present Illness Allison Osborn is a 78 year old female with cough variant asthma, sarcoidosis, and respiratory failure who presents for a six-month follow-up.  She has no current respiratory symptoms and describes her overall condition as 'doing pretty good' despite recent personal stressors, including the loss of her son-in-law and caring for her sister with vascular Alzheimer's. She is on Symbicort , 80 micrograms, two puffs in the morning and evening, and prednisone , 5 mg daily, with the option to increase to 10 mg during flare-ups, though she has not needed to increase the dose recently.  No cough, chest pain, or shortness of breath. She has a portable [employer] oxygen device at home but has not needed to use it recently. Her home oxygen levels have not dropped below 92-93% during activities.  She has a history of wheezing, which she has noticed before.  She had a slight vision change and obtained new glasses this year.  No Known Allergies  Immunization History  Administered  Date(s) Administered   INFLUENZA, HIGH DOSE SEASONAL PF 10/23/2015, 10/28/2016, 12/02/2017, 01/05/2019, 11/13/2019, 10/03/2020   Influenza Split 10/14/2012, 10/14/2013, 10/15/2014   Influenza Whole 10/15/2010, 10/15/2011   Influenza-Unspecified 10/14/2012, 10/14/2013, 10/15/2014   Moderna Covid-19 Fall Seasonal Vaccine 19yrs & older 10/26/2021   Moderna Covid-19 Vaccine Bivalent Booster 21yrs & up 01/03/2021   PFIZER Comirnaty(Gray Top)Covid-19 Tri-Sucrose Vaccine 12/13/2019   PFIZER(Purple Top)SARS-COV-2 Vaccination 04/15/2019, 05/06/2019, 12/13/2019   Pneumococcal Conjugate-13 10/26/2012, 12/29/2012   Pneumococcal Polysaccharide-23 05/22/2017   Td 08/16/2009   Tdap 09/30/2019    Past Medical History:  Diagnosis Date   GERD (gastroesophageal reflux  disease)    Hypertension    Sarcoidosis    Weight gain     Tobacco History: Social History   Tobacco Use  Smoking Status Never  Smokeless Tobacco Never   Counseling given: Not Answered   Outpatient Medications Prior to Visit  Medication Sig Dispense Refill   aspirin 81 MG tablet Take 81 mg by mouth daily.     budesonide -formoterol  (SYMBICORT ) 80-4.5 MCG/ACT inhaler Take 2 puffs first thing in am and then another 2 puffs about 12 hours later. 1 each 12   Calcium Carb-Cholecalciferol (CALCIUM 500 + D) 500-3.125 MG-MCG TABS Take by mouth.     cetirizine (ZYRTEC) 10 MG tablet Take 10 mg by mouth daily as needed.     famotidine (PEPCID) 20 MG tablet Take 20 mg by mouth daily.     losartan-hydrochlorothiazide (HYZAAR) 100-25 MG tablet Take 1 tablet by mouth daily.     meloxicam (MOBIC) 15 MG tablet Take 15 mg by mouth.     Meloxicam 10 MG CAPS      potassium chloride SA (KLOR-CON M) 20 MEQ tablet Take 1 tablet by mouth daily.     predniSONE  (DELTASONE ) 5 MG tablet TAKE 1 TO 2 TABLETS BY MOUTH DAILY 120 tablet 3   Red Yeast Rice Extract 600 MG CAPS Take by mouth.     No facility-administered medications prior to visit.   Review of Systems  Review of Systems  Constitutional: Negative.   Respiratory:  Negative for cough, shortness of breath and wheezing.     Physical Exam  BP 126/68   Pulse 97   Temp 97.7 F (36.5 C)   Ht 5' 4 (1.626 m)   Wt 129 lb 3.2 oz (58.6 kg)   SpO2 95% Comment: RA  BMI 22.18 kg/m  Physical Exam Constitutional:      General: She is not in acute distress.    Appearance: Normal appearance. She is not ill-appearing.  HENT:     Head: Normocephalic and atraumatic.  Cardiovascular:     Rate and Rhythm: Normal rate and regular rhythm.  Pulmonary:     Effort: Pulmonary effort is normal.     Breath sounds: Wheezing present.  Musculoskeletal:        General: Normal range of motion.  Skin:    General: Skin is warm and dry.  Neurological:      General: No focal deficit present.     Mental Status: She is alert and oriented to person, place, and time. Mental status is at baseline.  Psychiatric:        Mood and Affect: Mood normal.        Behavior: Behavior normal.        Thought Content: Thought content normal.        Judgment: Judgment normal.      Lab Results:  CBC    Component Value Date/Time  WBC 10.9 (H) 03/24/2019 1142   RBC 4.81 03/24/2019 1142   HGB 15.0 03/24/2019 1142   HCT 44.3 03/24/2019 1142   PLT 213.0 03/24/2019 1142   MCV 92.1 03/24/2019 1142   MCHC 33.8 03/24/2019 1142   RDW 13.5 03/24/2019 1142   LYMPHSABS 2.8 03/24/2019 1142   MONOABS 0.9 03/24/2019 1142   EOSABS 0.8 (H) 03/24/2019 1142   BASOSABS 0.1 03/24/2019 1142    BMET    Component Value Date/Time   NA 137 03/24/2019 1142   K 3.5 03/24/2019 1142   CL 99 03/24/2019 1142   CO2 31 03/24/2019 1142   GLUCOSE 103 (H) 03/24/2019 1142   BUN 12 03/24/2019 1142   CREATININE 0.73 03/24/2019 1142   CALCIUM 10.7 (H) 03/24/2019 1142    BNP No results found for: BNP  ProBNP    Component Value Date/Time   PROBNP 51.0 03/24/2019 1142    Imaging: No results found.   Assessment & Plan:   1. Chronic respiratory failure with hypoxia (HCC)  2. Cough variant asthma vs uacs  3. 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 She exhibits no symptoms such as cough, chest pain, or dyspnea. Oxygen saturation is stable at 95% without supplemental oxygen. Wheezing is present on the right side, not previously noted by her primary physician. Last chest x-ray a year ago indicated stable sarcoidosis with no new lung capacities or pleural effusions. - Continue Symbicort  80mcg two puffs morning and evening. - Continue prednisone  5 mg daily, with option to increase to 10 mg during flare-ups. -  Order annual chest x-ray. - Monitor oxygen saturation at home, watch for levels under 90%.  History of respiratory failure Respiratory status is stable with oxygen saturation above 90% during activities, and no need for supplemental oxygen.   Almarie LELON Ferrari, NP 09/10/2023

## 2023-09-10 NOTE — Addendum Note (Signed)
 Addended by: HOPE ALMARIE ORN on: 09/10/2023 09:55 AM   Modules accepted: Orders

## 2023-09-17 ENCOUNTER — Other Ambulatory Visit: Payer: Self-pay | Admitting: Internal Medicine

## 2023-09-23 ENCOUNTER — Ambulatory Visit: Payer: Self-pay | Admitting: Primary Care

## 2023-09-23 NOTE — Progress Notes (Signed)
 CXR showed unchanged chronic findings consistent with sarcoidosis. If not symptomatic no intervention needed right now

## 2023-09-24 NOTE — Progress Notes (Signed)
 Called and spoke with patient, advised of results and recommendations per Allison Ferrari NP.  She stated she is doing well right now, she has some sob at times, but it is not bad.  She is also tired, but is caring for a sister with alzheimer's.  Advised to call the office back if she should develop any bothersome symptoms or increasing sob.  She verbalized understanding.  Nothing further needed.

## 2024-03-22 ENCOUNTER — Ambulatory Visit: Admitting: Internal Medicine
